# Patient Record
Sex: Male | Born: 1955 | Race: White | Hispanic: No | Marital: Married | Smoking: Never smoker
Health system: Southern US, Community
[De-identification: ages and names within clinical notes are randomized; demographics above are authoritative.]

## PROBLEM LIST (undated history)

## (undated) DIAGNOSIS — I639 Cerebral infarction, unspecified: Secondary | ICD-10-CM

## (undated) DIAGNOSIS — I10 Essential (primary) hypertension: Secondary | ICD-10-CM

## (undated) HISTORY — PX: OTHER SURGICAL HISTORY: SHX169

---

## 2013-07-22 ENCOUNTER — Inpatient Hospital Stay: Payer: Self-pay | Admitting: Internal Medicine

## 2013-07-22 LAB — CBC
HCT: 45.6 % (ref 40.0–52.0)
HGB: 15.2 g/dL (ref 13.0–18.0)
MCH: 30.4 pg (ref 26.0–34.0)
MCHC: 33.4 g/dL (ref 32.0–36.0)
MCV: 91 fL (ref 80–100)
Platelet: 280 10*3/uL (ref 150–440)
RBC: 5.01 10*6/uL (ref 4.40–5.90)
RDW: 13.3 % (ref 11.5–14.5)
WBC: 9.5 10*3/uL (ref 3.8–10.6)

## 2013-07-22 LAB — BASIC METABOLIC PANEL
Anion Gap: 5 — ABNORMAL LOW (ref 7–16)
BUN: 14 mg/dL (ref 7–18)
CHLORIDE: 104 mmol/L (ref 98–107)
CO2: 27 mmol/L (ref 21–32)
CREATININE: 1.09 mg/dL (ref 0.60–1.30)
Calcium, Total: 9.1 mg/dL (ref 8.5–10.1)
EGFR (African American): >60
GLUCOSE: 115 mg/dL — AB (ref 65–99)
Osmolality: 273 (ref 275–301)
Potassium: 3.6 mmol/L (ref 3.5–5.1)
Sodium: 136 mmol/L (ref 136–145)

## 2013-07-22 LAB — TROPONIN I
Troponin-I: <0.02 ng/mL
Troponin-I: <0.02 ng/mL

## 2013-07-22 LAB — CK TOTAL AND CKMB (NOT AT ARMC)
CK, Total: 208 U/L
CK-MB: 1.2 ng/mL (ref 0.5–3.6)

## 2013-07-23 DIAGNOSIS — I517 Cardiomegaly: Secondary | ICD-10-CM

## 2013-07-23 LAB — BASIC METABOLIC PANEL
ANION GAP: 9 (ref 7–16)
BUN: 12 mg/dL (ref 7–18)
Calcium, Total: 9.2 mg/dL (ref 8.5–10.1)
Chloride: 103 mmol/L (ref 98–107)
Co2: 24 mmol/L (ref 21–32)
Creatinine: 1.03 mg/dL (ref 0.60–1.30)
EGFR (African American): >60
Glucose: 162 mg/dL — ABNORMAL HIGH (ref 65–99)
OSMOLALITY: 275 (ref 275–301)
Potassium: 3.9 mmol/L (ref 3.5–5.1)
SODIUM: 136 mmol/L (ref 136–145)

## 2013-07-23 LAB — TROPONIN I
Troponin-I: <0.02 ng/mL
Troponin-I: <0.02 ng/mL

## 2013-07-23 LAB — LIPID PANEL
CHOLESTEROL: 240 mg/dL — AB (ref 0–200)
HDL Cholesterol: 29 mg/dL — ABNORMAL LOW (ref 40–60)
LDL CHOLESTEROL, CALC: 162 mg/dL — AB (ref 0–100)
TRIGLYCERIDES: 245 mg/dL — AB (ref 0–200)
VLDL CHOLESTEROL, CALC: 49 mg/dL — AB (ref 5–40)

## 2013-07-23 LAB — CK TOTAL AND CKMB (NOT AT ARMC)
CK, Total: 270 U/L
CK, Total: 246 U/L
CK-MB: 1.2 ng/mL (ref 0.5–3.6)
CK-MB: 1.5 ng/mL (ref 0.5–3.6)

## 2013-07-23 LAB — MAGNESIUM: Magnesium: 2.1 mg/dL

## 2013-07-23 LAB — HEMOGLOBIN A1C: Hemoglobin A1C: 6.4 % — ABNORMAL HIGH (ref 4.2–6.3)

## 2013-07-23 LAB — TSH: THYROID STIMULATING HORM: 0.739 u[IU]/mL

## 2013-09-05 DIAGNOSIS — E782 Mixed hyperlipidemia: Secondary | ICD-10-CM | POA: Insufficient documentation

## 2013-11-22 DIAGNOSIS — I69359 Hemiplegia and hemiparesis following cerebral infarction affecting unspecified side: Secondary | ICD-10-CM | POA: Insufficient documentation

## 2013-11-22 DIAGNOSIS — I69351 Hemiplegia and hemiparesis following cerebral infarction affecting right dominant side: Secondary | ICD-10-CM | POA: Insufficient documentation

## 2014-03-05 DIAGNOSIS — R001 Bradycardia, unspecified: Secondary | ICD-10-CM | POA: Insufficient documentation

## 2014-03-06 DIAGNOSIS — I6529 Occlusion and stenosis of unspecified carotid artery: Secondary | ICD-10-CM | POA: Insufficient documentation

## 2014-07-24 DIAGNOSIS — E1122 Type 2 diabetes mellitus with diabetic chronic kidney disease: Secondary | ICD-10-CM | POA: Insufficient documentation

## 2014-07-24 DIAGNOSIS — R739 Hyperglycemia, unspecified: Secondary | ICD-10-CM | POA: Insufficient documentation

## 2014-07-24 DIAGNOSIS — N182 Chronic kidney disease, stage 2 (mild): Secondary | ICD-10-CM

## 2014-09-21 NOTE — Consult Note (Signed)
Brief Consult Note: Diagnosis: Left hemishperic CVA, question of small ulcer left carotid.   Recommend further assessment or treatment.   Comments: Patient was taking a bath and unavailable at the time I was able to see him.  He has minimal carotid plaque bilaterally, less than 20% diameter reduction by CT which is insignificant and the question of a small (1 mm) ulcer is insignificant if it does exist.  He has never been on antiplatelet therapy and even in the circumstances of a clinically significant ulceration he would need to fail dual antiplatelet therapy before surgery would be considered.  I would not recommend angiography at this time nor any surgical intervention.  I strongly agree with Dr Manuella Ghazi he should be placed on [facility] ASA 81 mg and a statin.  Work up for possible occult afib should be completed as hsi carotid disease is relatively speaking minor and unlikely to be the source.  Long term better control of his BP will be essential.  Electronic Signatures: Hortencia Pilar (MD)  (Signed 24-Feb-15 18:01)  Authored: Brief Consult Note   Last Updated: 24-Feb-15 18:01 by Hortencia Pilar (MD)

## 2014-09-21 NOTE — H&P (Signed)
PATIENT NAME:  [name], [name] MR#:  [id number] DATE OF BIRTH:  [birth year]  DATE OF ADMISSION:  07/22/2013  PRIMARY CARE PHYSICIAN: None.   REFERRING PHYSICIAN: Dr. Reita Cliche.   CHIEF COMPLAINT: Slurred speech.   HISTORY OF PRESENT ILLNESS: [name] is a 59 year old male with history of obesity. Has not followed with any physician for many years. Comes to the Emergency Department with complaints of slurred speech. Started this morning around 10:30 while in church. This was also associated with some dizziness. The patient denied having any weakness in any part of the body. Concerning this, came to the Emergency Department. Workup in the Emergency Department: The patient was found to have blood pressure of 230/120. The patient received multiple high doses of labetalol without much improvement. CT head without contrast did not show any acute stroke. There were old lacunar infarcts in the pons and right cerebellar peduncle, as well as anterior aspect of the thalamus. The patient currently started on nicardipine drip. The rest of all of the labs are completely within normal limits. The patient denies having any chest pain, palpitations.   PAST MEDICAL HISTORY: None.   PAST SURGICAL HISTORY: None.   ALLERGIES: No known drug allergies.   HOME MEDICATIONS: None.   SOCIAL HISTORY: No history of smoking, drinking alcohol or using illicit drugs. Married, lives with his wife. Retired.   FAMILY HISTORY: Both parents had hypertension.   REVIEW OF SYSTEMS:  CONSTITUTIONAL: Denies any generalized weakness.  EYES: No change in vision.  ENT: No change in hearing. No sore throat.  RESPIRATORY: No cough, shortness of breath.  CARDIOVASCULAR: No chest pain, palpitations.  GASTROINTESTINAL: No nausea, vomiting, abdominal pain.  GENITOURINARY: No dysuria or hematuria.  ENDOCRINE: No polyuria or polydipsia.  SKIN: No rash or lesions.  HEMATOLOGIC: No easy bruising or bleeding.  NEUROLOGIC: Had slurred speech. No  weakness or numbness.  PSYCHIATRIC: No depression.   PHYSICAL EXAMINATION:  GENERAL: This is a well-built, well-nourished, obese male lying down in the bed, not in distress.  VITAL SIGNS: Temperature , pulse 79, blood pressure 210/110, respiratory rate of 16, oxygen saturation 96% on 2 liters of oxygen.  HEENT: Head normocephalic, atraumatic. There is no scleral icterus. Conjunctivae normal. Pupils equal and react to light. Extraocular movements are intact. Mucous membranes moist. No pharyngeal erythema.  NECK: Supple. No lymphadenopathy. No JVD. No carotid bruit. No thyromegaly.  CHEST: Has no focal tenderness.  LUNGS: Bilaterally clear to auscultation.  HEART: S1, S2 regular. No murmurs are heard. No pedal edema. Pulses 2+.  ABDOMEN: Obese. Bowel sounds present. Soft, nontender and nondistended. Could not appreciate any hepatosplenomegaly.  NEUROLOGIC: The patient is alert, oriented to place, person and time. Cranial nerves II through XII intact. Motor 5/5 in upper and lower extremities.   LABORATORY DATA: CBC and CMP are completely within normal limits. CT head without contrast: Old lacunar infarcts as mentioned above.   Troponin less than 0.02.   EKG, 12-lead: Normal sinus rhythm with no ST-T wave abnormalities. Sinus tachycardia.   ASSESSMENT AND PLAN: [name] is a 59 year old male, comes to the Emergency Department with accelerated hypertension with neurological symptoms.  1. Hypertension, malignant: Admit the patient to the intensive care unit. Continue with the nicardipine drip. Consider controlling the blood pressure over 2 to 3 weeks' time. Will keep the blood pressure around 170 to 180. Also concerned about the patient having obstructive sleep apnea, which might be contributing to the hypertension.  2. Obesity: Counseled with the patient regarding diet  and exercise.   3. Keep the patient on deep vein thrombosis prophylaxis with Lovenox.   TIME SPENT: 50 minutes.     ____________________________ Monica Becton, MD pv:gb D: 07/22/2013 21:29:24 ET T: 07/22/2013 23:17:47 ET JOB#: [id number]  cc: Monica Becton, MD, <Dictator> Monica Becton MD ELECTRONICALLY SIGNED 08/05/2013 22:34

## 2014-09-21 NOTE — Consult Note (Signed)
Primary Care Physician:  Monica Becton : [facility], [address] [place], [place] [address], [place] [phone]  Reason for Consult: Admit Date: 23-Jul-2013  Chief Complaint: rigth face weakness   History of Present Illness: History of Present Illness:   [name] is a 59 year old male with history of obesity. Has not followed with any physician for many years. Comes to the Emergency Department with complaints of slurred speech. Started on 07/22/13 morning around 10:30 while in church, pain less. This was also associated with some dizziness. No recent head trauma, fever, similar recent episode etc. Concerning this, came to the Emergency Department. Workup in the Emergency Department: The patient was found to have blood pressure of 230/120. The patient received multiple high doses of labetalol without much improvement. CT head without contrast did not show any acute stroke. There were old lacunar infarcts in the pons and right cerebellar peduncle, as well as anterior aspect of the thalamus. The patient currently started on nicardipine drip. The rest of all of the labs are completely within normal limits. The patient denies having any chest pain, palpitations.  was not on any antiplatelets. admission found to have left carotid possible ulcer on [place], now after CTA - very tiny possible ulcer on left carotid.  MEDICAL HISTORY: None.  (but likely had undocumented HTN - ECHO showed moderate LVH) SURGICAL HISTORY: None.  No known drug allergies.  MEDICATIONS: None.  HISTORY: No history of smoking, drinking alcohol or using illicit drugs. Married, lives with his wife. Retired.  HISTORY: Both parents had hypertension.   ROS:  General denies complaints   HEENT no complaints   Lungs no complaints   Cardiac no complaints   GI no complaints   GU no complaints   Musculoskeletal no complaints   Extremities no complaints   Skin no complaints   Neuro slurred speech, facial  droop, right hand weakness   Endocrine no complaints   Psych no complaints   Past Medical/Surgical Hx:  old Lacunar infarcts: per H/P  Obesity:   [facility] Current Meds:  Acetaminophen * tablet, ( Tylenol (325 mg) tablet)  650 mg Oral q4h PRN for pain or temp. greater than 100.4  - Indication: Pain/Fever  amLODIPine tablet, ( Norvasc)  10 mg Oral daily  - Indication: Hypertension/ Angina  Enoxaparin injection, ( Lovenox injection )  40 mg, Subcutaneous, <User Schedule> ( every 1 day: 21:00 )  Indication: Prophylaxis or treatment of thromboembolic disorders, Monitor Anticoags per hospital protocol  Lisinopril tablet, ( Zestril)  20 mg Oral daily  - Indication: Hypertension/ CHF  Ondansetron injection, ( Zofran injection )  4 mg, IV push, q4h PRN for Nausea/Vomiting  Indication: Nausea/ Vomiting  Nursing Saline Flush, 3 to 6 ml, IV push, Q1M PRN for IV Maintenance  atorvaSTATin tablet, 20 mg Oral daily  - Indication: Hypercholesterolemia  hydrALAZINE HCl injection, ( Apresoline injection )  10 mg, IV push, q6h PRN for hypertension  Indication: Hypertension/ CHF, Give when systolic>180  Hydrochlorothiazide tablet,  ( HCTZ)  25 mg Oral daily  - Indication: Edema/ Hypertension/ Diuresis/ CHF  Aspirin Chewable, 81 mg Oral daily  - Indication: Pain/Fever/Thromboembolic Disorders/Post MI/Prophylaxis MI  Allergies:  No Known Allergies:   Vital Signs: **Vital Signs.:   24-Feb-15 13:29  Vital Signs Type Routine  Temperature Temperature (F) 97.9  Celsius 36.6  Temperature Source oral  Pulse Pulse 93  Respirations Respirations 20  Systolic BP Systolic BP 342  Diastolic BP (mmHg) Diastolic BP (mmHg) 91  Mean BP 115  Pulse Ox % Pulse Ox % 96  Pulse Ox Activity Level  At rest  Oxygen Delivery Room Air/ 21 %   EXAM: General Exam Patient looks appropriate of age, well built, nourished and appropriately groomed.   Cardiovascular Exam: S1, S2 heart sounds present Carotid exam  revealed no bruit Lung exam was clear to auscultation belly soft but protruberant  Neurological Exam      Mental Status:      Alert,     Oriented to time, place, person and situation     Attention span and concentration seemed appropriate     Memory seemed OK.     Intact naming, repetition, comprehension.  (mild dysarthria)     Followed 2 step commands - no dysarthria     Fund of knowledge seemed appropriate for age and health status.       Cranial Nerves:      Olfactory and vagus nerves not are examined      Visual fields were full      Pupils were equal, round and reactive to light and accommodation      Extra-ocular movements are normal      Facial sensations are normal       Face is asymmetric (right facial weakness)       Finger rub was heard symmetric in both ears      Palate and uvular movements are normal and oral sensations are OK      Neck muscle strength and shoulder shrug is normal      Tongue protrusion and uvular elevation are normal       Motor Exam:      Tone is normal in all extremities       Muscle strength in all extremities is 5/5 except right pronator drift       No abnormal movements, fasciculations or atrophy seen       Deep Tendon Reflexes:      symmetric 2 +      Right Toes are down going,  Left Toes are down going            Sensory Exam:      Sensations were intact to light touch in all extremities      Vibration and proprioception are also intact            Co-ordination:      Finger to nose is normal            Gait:      Gait and station seemed OK.  Lab Results: Thyroid:  23-Feb-15 02:43   Thyroid Stimulating Hormone 0.739 (0.45-4.50 (International Unit)  ----------------------- Pregnant patients have  different reference  ranges for TSH:  - - - - - - - - - -  Pregnant, first trimetser:  0.36 - 2.50 uIU/mL)  LabObservation:  23-Feb-15 16:29   OBSERVATION Reason for Test  Routine Chem:  23-Feb-15 02:43   Hemoglobin A1c  ([facility])  6.4 (The American Diabetes Association recommends that a primary goal of therapy should be <7% and that physicians should reevaluate the treatment regimen in patients with HbA1c values consistently >8%.)  Glucose, Serum  162  BUN 12  Creatinine (comp) 1.03  Sodium, Serum 136  Potassium, Serum 3.9  Chloride, Serum 103  CO2, Serum 24  Calcium (Total), Serum 9.2  Anion Gap 9  Osmolality (calc) 275  eGFR (African American) >60  eGFR (Non-African American) >60 (eGFR values <24m/min/1.73 m2 may be an  indication of chronic kidney disease (CKD). Calculated eGFR is useful in patients with stable renal function. The eGFR calculation will not be reliable in acutely ill patients when serum creatinine is changing rapidly. It is not useful in  patients on dialysis. The eGFR calculation may not be applicable to patients at the low and high extremes of body sizes, pregnant women, and vegetarians.)  Magnesium, Serum 2.1 (1.8-2.4 THERAPEUTIC RANGE: 4-7 mg/dL TOXIC: > 10 mg/dL  -----------------------)  Cholesterol, Serum  240  Triglycerides, Serum  245  HDL (INHOUSE)  29  VLDL Cholesterol Calculated  49  LDL Cholesterol Calculated  162 (Result(s) reported on 23 Jul 2013 at 03:19AM.)  Cardiac:  23-Feb-15 06:34   Troponin I < 0.02 (0.00-0.05 0.05 ng/mL or less: NEGATIVE  Repeat testing in 3-6 hrs  if clinically indicated. >0.05 ng/mL: POTENTIAL  MYOCARDIAL INJURY. Repeat  testing in 3-6 hrs if  clinically indicated. NOTE: An increase or decrease  of 30% or more on serial  testing suggests a  clinically important change)  CK, Total 246 (39-308 NOTE: NEW REFERENCE RANGE  07/02/2013)  CPK-MB, Serum 1.5 (Result(s) reported on 23 Jul 2013 at 07:11AM.)  Routine Hem:  22-Feb-15 18:27   WBC (CBC) 9.5  RBC (CBC) 5.01  Hemoglobin (CBC) 15.2  Hematocrit (CBC) 45.6  Platelet Count (CBC) 280 (Result(s) reported on 22 Jul 2013 at 06:39PM.)  MCV 91  MCH 30.4  MCHC 33.4  RDW 13.3    Radiology Impression: Radiology Impression: MRI brain - left MCA territory - lentriculostriate branch ischemic infract - small to medium vessel disease. Old pontine and right middle cerebellar peduncle. Other WM microvascular ischemic changes.    CTA - very small ulcer on left ICA origin   Impression/Recommendations: Recommendations:   Stroke: left MCA territory - lentriculostriate branch ischemic infract in left putamenand symptoms: right lower facial weakness, dysarthria, right UE pronator drift and possible right face and arm paresthesia, weakness appropriate ataxia of right UE.of onset: 07/22/13 10:30 am, was not candidate for tPA due to low NIHSS (total 4, right face 2, dysarthria 1, right UE 1)mechanism of stroke - small vessel disease - can't rule out the role of embolization from very small ulcer from left ICA origin (but it is very small around 1 mm).agree with vascular consult - they may consider catheter angiography (pt don't believe in medicines and may not want to pursue anything) antiplatelet agent - none, up: if not already ordered, please consider moderate LVH rest OK,vessel ultrasound (carotid and vertebrals): as abovemonitoring: to look for arrythmia as potential cause of infractlipid profile: elevated LDL, Triglyceride and low HDL, avoid extreme  hypo/hyper tension and gentle BP reducation in peristroke period to maintain perfusion in ischemic penumbra. (160/90 with MAP 110. Consider using short acting meds with short half life, avoid NTG and nitropruside due to chance of increasing intracranial pressure)treat fever early (hyperthermia impairs stroke recovery), aggressive PT/OT/ST, early bedside swallow evaluation. Antiplatelet therapy with ASA 81 mg /day (pt not interested in taking any meds in general)Agree with atorvastatin (Lipid lowering and non lipid lowering effects of statin are useful in secondary stroke prevention.pt should be considered for evaluation of OSA which is a  treatable risk factor (obese man, high mallampati score, some excessive daytime sleepiness).Post discharge long term cardiac rhythm monitoring has shown much higher incidence of paroxysmal A.fib in patient's with cryptogenic ischemic infract.DVT prophylaxis Consider flu vaccine and multivitamines.  will follow.  Electronic Signatures: Ray Church (MD)  (Signed [phone] 16:04)  Authored:  Primary Care Physician, Consult, History of Present Illness, Review of Systems, PAST MEDICAL/SURGICAL HISTORY, Current Medications, ALLERGIES, NURSING VITAL SIGNS, Physical Exam-, LAB RESULTS, RADIOLOGY RESULTS, Recommendations   Last Updated: 24-Feb-15 16:04 by Ray Church (MD)

## 2014-09-21 NOTE — Discharge Summary (Signed)
PATIENT NAME:  NIKALAS, BRAMEL MR#:  [id number] DATE OF BIRTH:  19-Aug-1955  DATE OF ADMISSION:  07/22/2013  DATE OF DISCHARGE:  07/25/2013   DISCHARGE DIAGNOSES:  1.  Acute stroke, residual right-sided weakness, and need for physical therapy.  2.  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 Vascular surgery called and they suggested antiplatelet with aspirin.  3.  Hypertension, uncontrolled. Started on medication.  4.  Hyperlipidemia.  5.  HbA1c 6.4. Advised to have low-sugar diet.   CODE STATUS: FULL CODE.   MEDICATIONS ON DISCHARGE:  1.  Lisinopril 20 mg once a day.  2.  Atorvastatin 20 mg once a day.  3.  Aspirin 81 mg once a day. 4.  Metoprolol 25 mg two times a day.  5.  Amlodipine 10 mg once a day.  6.  Hydrochlorothiazide 25 mg oral tablet once a day.   DIET ON DISCHARGE: Low-sodium, low-fat, low-cholesterol, carbohydrate-controlled ADA diet. Diet consistency: Regular.   ACTIVITY: As tolerated.   TIMEFRAME TO FOLLOW-UP: Within 1 to 2 weeks with primary care physician and 2 to 4 weeks with Dr. Manuella Ghazi in neurology clinic.   HISTORY OF PRESENTING ILLNESS: A 59 year old with a past medical history of obesity who came to the Emergency Department with complaint of slurred speech. It started in the morning around 10:30 when he was in church with some dizziness also. He denied any weakness in any part of the body. Concerning this, came to the Emergency Department.   In the ER, his blood pressure was found to be 230/120. Received multiple doses of labetalol without much improvement. CT scan of the head did not show any stroke but there was some old lacunar infarct   HOSPITAL COURSE AND STAY:  1.  He was started on nicardipine drip and admitted to critical care unit overnight. His blood pressure came fairly under control to systolic 157W and 620B, but he still had some persistent weakness on the right side of his body and some facial weakness, so we did MRI of the brain  which  was positive for the new stroke. Full stroke work-up was done including carotid Doppler and echocardiogram. On the carotid Doppler study, there was some questionable observation on the left side of internal carotid artery, so CT angiogram of the carotid artery was done as per recommendation by vascular surgeon, and it was 1 mm ulceration. As per vascular, that was too small to have any effect like stroke, and advised just to continue on aspirin and antiplatelet agent. Neurologic consult was also called in and they agreed and advised to follow up in cardiology clinic for further long-term monitoring of arrhythmia.  2.  Malignant hypertension on presentation. Initially was on a Cardizem drip but later on oral medication started and it was fairly under control.  3.  Hyperlipidemia. Started on statin and remained under control.  4.  Hyperglycemia, HbA1c was 6.4. Advised to have proper diet control and follow with primary care physician.   CONSULTATIONS IN THE HOSPITAL: Neurology consult with Dr. Jennings Books, vascular consult with Dr. Delana Meyer.  IMPORTANT LABORATORY RESULTS: Creatinine was 1.09, sodium was 136, potassium was 316. WBC was 9.5, hemoglobin 15.2, platelet count 280. Troponin less than 0.02.   CT scan of the head was negative.   Echocardiogram:  Left ventricular ejection fraction 55% to 60%, normal global left ventricular systolic function, moderate concentric left ventricular hypertrophy, mild aortic valve sclerosis without stenosis.   MRA of the brain without contrast:  Acute lenticulostriate territory infarct affecting the left basal ganglia and posterior limb of internal capsule. No hemorrhage.   CT angiography of the neck for carotid: One mm ulceration left internal carotid artery associated with calcified and noncalcified plaque.  Correlate clinically for acute left peri-infarct.   TOTAL TIME SPENT ON THIS DISCHARGE: 40 minutes.   ____________________________ Ceasar Lund Anselm Jungling,  MD vgv:np D: 07/29/2013 00:04:00 ET T: 07/29/2013 16:39:30 ET JOB#: [id number]  cc: Ceasar Lund. Anselm Jungling, MD, <Dictator> Vaughan Basta MD ELECTRONICALLY SIGNED 08/06/2013 7:52

## 2015-01-24 DIAGNOSIS — Z0000 Encounter for general adult medical examination without abnormal findings: Secondary | ICD-10-CM | POA: Insufficient documentation

## 2015-05-25 ENCOUNTER — Emergency Department
Admission: EM | Admit: 2015-05-25 | Discharge: 2015-05-26 | Disposition: A | Discharge status: Home / Self Care | Payer: BC Managed Care – PPO | Attending: Emergency Medicine | Admitting: Emergency Medicine

## 2015-05-25 ENCOUNTER — Emergency Department: Payer: BC Managed Care – PPO

## 2015-05-25 ENCOUNTER — Encounter: Payer: Self-pay | Admitting: *Deleted

## 2015-05-25 DIAGNOSIS — R200 Anesthesia of skin: Secondary | ICD-10-CM | POA: Diagnosis present

## 2015-05-25 DIAGNOSIS — R519 Headache, unspecified: Secondary | ICD-10-CM

## 2015-05-25 DIAGNOSIS — F419 Anxiety disorder, unspecified: Secondary | ICD-10-CM | POA: Insufficient documentation

## 2015-05-25 DIAGNOSIS — R51 Headache: Secondary | ICD-10-CM | POA: Insufficient documentation

## 2015-05-25 DIAGNOSIS — I10 Essential (primary) hypertension: Secondary | ICD-10-CM | POA: Insufficient documentation

## 2015-05-25 DIAGNOSIS — R202 Paresthesia of skin: Secondary | ICD-10-CM | POA: Diagnosis not present

## 2015-05-25 DIAGNOSIS — R4781 Slurred speech: Secondary | ICD-10-CM | POA: Insufficient documentation

## 2015-05-25 HISTORY — DX: Essential (primary) hypertension: I10

## 2015-05-25 HISTORY — DX: Cerebral infarction, unspecified: I63.9

## 2015-05-25 LAB — CBC
HEMATOCRIT: 40 % (ref 40.0–52.0)
HEMOGLOBIN: 13.9 g/dL (ref 13.0–18.0)
MCH: 31.2 pg (ref 26.0–34.0)
MCHC: 34.8 g/dL (ref 32.0–36.0)
MCV: 89.8 fL (ref 80.0–100.0)
Platelets: 291 10*3/uL (ref 150–440)
RBC: 4.45 MIL/uL (ref 4.40–5.90)
RDW: 12.8 % (ref 11.5–14.5)
WBC: 9.4 10*3/uL (ref 3.8–10.6)

## 2015-05-25 LAB — PROTIME-INR
INR: 1.03
Prothrombin Time: 13.7 seconds (ref 11.4–15.0)

## 2015-05-25 LAB — COMPREHENSIVE METABOLIC PANEL
ALBUMIN: 4.3 g/dL (ref 3.5–5.0)
ALK PHOS: 96 U/L (ref 38–126)
ALT: 37 U/L (ref 17–63)
ANION GAP: 10 (ref 5–15)
AST: 28 U/L (ref 15–41)
BILIRUBIN TOTAL: 0.5 mg/dL (ref 0.3–1.2)
BUN: 21 mg/dL — AB (ref 6–20)
CALCIUM: 9.3 mg/dL (ref 8.9–10.3)
CO2: 25 mmol/L (ref 22–32)
Chloride: 102 mmol/L (ref 101–111)
Creatinine, Ser: 1.21 mg/dL (ref 0.61–1.24)
GFR calc Af Amer: >60 mL/min (ref >60)
GLUCOSE: 157 mg/dL — AB (ref 65–99)
Potassium: 3.2 mmol/L — ABNORMAL LOW (ref 3.5–5.1)
Sodium: 137 mmol/L (ref 135–145)
TOTAL PROTEIN: 8.5 g/dL — AB (ref 6.5–8.1)

## 2015-05-25 LAB — DIFFERENTIAL
Basophils Absolute: 0 10*3/uL (ref 0–0.1)
Basophils Relative: 0 %
EOS PCT: 1 %
Eosinophils Absolute: 0.1 10*3/uL (ref 0–0.7)
LYMPHS ABS: 2 10*3/uL (ref 1.0–3.6)
LYMPHS PCT: 21 %
MONOS PCT: 11 %
Monocytes Absolute: 1 10*3/uL (ref 0.2–1.0)
NEUTROS PCT: 67 %
Neutro Abs: 6.3 10*3/uL (ref 1.4–6.5)

## 2015-05-25 LAB — APTT: aPTT: 32 seconds (ref 24–36)

## 2015-05-25 NOTE — ED Notes (Addendum)
Pt arrived to ED reporting right sided numbness \\\[id number]\nd heaviness beginning yesterday at 1000am. Pt has a hx of stroke 2 years ago that effected right side but pt and pts wife agree right sided facial droop is new. \\\[id number]\[id number]\Pt reports having slurred speech on and off since last stroke but PCP is aware. Pt reports numbness has decreased since yesterday. Pt reports head feels "cloudy and full of pressure"

## 2015-05-25 NOTE — Discharge Instructions (Signed)
You examine evaluation are reassuring emergent chart. Increase your aspirin from 81 mg to 325 mg daily. Follow-up with primary care physician X2 to 3 days. Also recommended follow-up with neurologist at next available.  Return to the emergency department for any worsening condition or any new weakness, numbness, tingling, slurred speech, confusion or altered mental status.  Transient Ischemic Attack A transient ischemic attack (TIA) is a "warning stroke" that causes stroke-like symptoms. Unlike a stroke, a TIA does not cause permanent damage to the brain. The symptoms of a TIA can happen very fast and do not last long. It is important to know the symptoms of a TIA and what to do. This can help prevent a major stroke or death. CAUSES  A TIA is caused by a temporary blockage in an artery in the brain or neck (carotid artery). The blockage does not allow the brain to get the blood supply it needs and can cause different symptoms. The blockage can be caused by either:  A blood clot.  Fatty buildup (plaque) in a neck or brain artery. RISK FACTORS  High blood pressure (hypertension).  High cholesterol.  Diabetes mellitus.  Heart disease.  The buildup of plaque in the blood vessels (peripheral artery disease or atherosclerosis).  The buildup of plaque in the blood vessels that provide blood and oxygen to the brain (carotid artery stenosis).  An abnormal heart rhythm (atrial fibrillation).  Obesity.  Using any tobacco products, including cigarettes, chewing tobacco, or electronic cigarettes.  Taking oral contraceptives, especially in combination with using tobacco.  Physical inactivity.  A diet high in fats, salt (sodium), and calories.  Excessive alcohol use.  Use of illegal drugs (especially cocaine and methamphetamine).  Being male.  Being African American.  Being over the age of 72 years.  Family history of stroke.  Previous history of blood clots, stroke, TIA, or heart  attack.  Sickle cell disease. SIGNS AND SYMPTOMS  TIA symptoms are the same as a stroke but are temporary. These symptoms usually develop suddenly, or may be newly present upon waking from sleep:  Sudden weakness or numbness of the face, arm, or leg, especially on one side of the body.  Sudden trouble walking or difficulty moving arms or legs.  Sudden confusion.  Sudden personality changes.  Trouble speaking (aphasia) or understanding.  Difficulty swallowing.  Sudden trouble seeing in one or both eyes.  Double vision.  Dizziness.  Loss of balance or coordination.  Sudden severe headache with no known cause.  Trouble reading or writing.  Loss of bowel or bladder control.  Loss of consciousness. DIAGNOSIS  Your health care provider may be able to determine the presence or absence of a TIA based on your symptoms, history, and physical exam. CT scan of the brain is usually performed to help identify a TIA. Other tests may include:  Electrocardiography (ECG).  Continuous heart monitoring.  Echocardiography.  Carotid ultrasonography.  MRI.  A scan of the brain circulation.  Blood tests. TREATMENT  Since the symptoms of TIA are the same as a stroke, it is important to seek treatment as soon as possible. You may need a medicine to dissolve a blood clot (thrombolytic) if that is the cause of the TIA. This medicine cannot be given if too much time has passed. Treatment may also include:   Rest, oxygen, fluids through an IV tube, and medicines to thin the blood (anticoagulants).  Measures will be taken to prevent short-term and long-term complications, including infection from breathing foreign  material into the lungs (aspiration pneumonia), blood clots in the legs, and falls.  Procedures to either remove plaque in the carotid arteries or dilate carotid arteries that have narrowed due to plaque. Those procedures are:  Carotid endarterectomy.  Carotid angioplasty and  stenting.  Medicines and diet may be used to address diabetes, high blood pressure, and other underlying risk factors. HOME CARE INSTRUCTIONS   Take medicines only as directed by your health care provider. Follow the directions carefully. Medicines may be used to control risk factors for a stroke. Be sure you understand all your medicine instructions.  You may be told to take aspirin or the anticoagulant warfarin. Warfarin needs to be taken exactly as instructed.  Taking too much or too little warfarin is dangerous. Too much warfarin increases the risk of bleeding. Too little warfarin continues to allow the risk for blood clots. While taking warfarin, you will need to have regular blood tests to measure your blood clotting time. A PT blood test measures how long it takes for blood to clot. Your PT is used to calculate another value called an INR. Your PT and INR help your health care provider to adjust your dose of warfarin. The dose can change for many reasons. It is critically important that you take warfarin exactly as prescribed.  Many foods, especially foods high in vitamin K can interfere with warfarin and affect the PT and INR. Foods high in vitamin K include spinach, kale, broccoli, cabbage, collard and turnip greens, Brussels sprouts, peas, cauliflower, seaweed, and parsley, as well as beef and pork liver, green tea, and soybean oil. You should eat a consistent amount of foods high in vitamin K. Avoid major changes in your diet, or notify your health care provider before changing your diet. Arrange a visit with a dietitian to answer your questions.  Many medicines can interfere with warfarin and affect the PT and INR. You must tell your health care provider about any and all medicines you take; this includes all vitamins and supplements. Be especially cautious with aspirin and anti-inflammatory medicines. Do not take or discontinue any prescribed or over-the-counter medicine except on the  advice of your health care provider or pharmacist.  Warfarin can have side effects, such as excessive bruising or bleeding. You will need to hold pressure over cuts for longer than usual. Your health care provider or pharmacist will discuss other potential side effects.  Avoid sports or activities that may cause injury or bleeding.  Be careful when shaving, flossing your teeth, or handling sharp objects.  Alcohol can change the body's ability to handle warfarin. It is best to avoid alcoholic drinks or consume only very small amounts while taking warfarin. Notify your health care provider if you change your alcohol intake.  Notify your dentist or other health care providers before procedures.  Eat a diet that includes 5 or more servings of fruits and vegetables each day. This may reduce the risk of stroke. Certain diets may be prescribed to address high blood pressure, high cholesterol, diabetes, or obesity.  A diet low in sodium, saturated fat, trans fat, and cholesterol is recommended to manage high blood pressure.  A diet low in saturated fat, trans fat, and cholesterol, and high in fiber may control cholesterol levels.  A controlled-carbohydrate, controlled-sugar diet is recommended to manage diabetes.  A reduced-calorie diet that is low in sodium, saturated fat, trans fat, and cholesterol is recommended to manage obesity.  Maintain a healthy weight.  Stay physically active. It  is recommended that you get at least 30 minutes of activity on most or all days.  Do not use any tobacco products, including cigarettes, chewing tobacco, or electronic cigarettes. If you need help quitting, ask your health care provider.  Limit alcohol intake to no more than 1 drink per day for nonpregnant women and 2 drinks per day for men. One drink equals 12 ounces of beer, 5 ounces of wine, or 1 ounces of hard liquor.  Do not abuse drugs.  A safe home environment is important to reduce the risk of  falls. Your health care provider may arrange for specialists to evaluate your home. Having grab bars in the bedroom and bathroom is often important. Your health care provider may arrange for equipment to be used at home, such as raised toilets and a seat for the shower.  Follow all instructions for follow-up with your health care provider. This is very important. This includes any referrals and lab tests. Proper follow-up can prevent a stroke or another TIA from occurring. PREVENTION  The risk of a TIA can be decreased by appropriately treating high blood pressure, high cholesterol, diabetes, heart disease, and obesity, and by quitting smoking, limiting alcohol, and staying physically active. SEEK MEDICAL CARE IF:  You have personality changes.  You have difficulty swallowing.  You are seeing double.  You have dizziness.  You have a fever. SEEK IMMEDIATE MEDICAL CARE IF:  Any of the following symptoms may represent a serious problem that is an emergency. Do not wait to see if the symptoms will go away. Get medical help right away. Call your local emergency services (911 in [place] Do not drive yourself to the hospital.  You have sudden weakness or numbness of the face, arm, or leg, especially on one side of the body.  You have sudden trouble walking or difficulty moving arms or legs.  You have sudden confusion.  You have trouble speaking (aphasia) or understanding.  You have sudden trouble seeing in one or both eyes.  You have a loss of balance or coordination.  You have a sudden, severe headache with no known cause.  You have new chest pain or an irregular heartbeat.  You have a partial or total loss of consciousness. MAKE SURE YOU:   Understand these instructions.  Will watch your condition.  Will get help right away if you are not doing well or get worse.   This information is not intended to replace advice given to you by your health care provider. Make sure you discuss  any questions you have with your health care provider.   Document Released: 02/24/2005 Document Revised: 06/07/2014 Document Reviewed: 08/22/2013 Elsevier Interactive Patient Education [employer].

## 2015-05-25 NOTE — ED Provider Notes (Signed)
[facility] Emergency Department Provider Note   ____________________________________________  Time seen:  I have reviewed the triage vital signs and the triage nursing note.  HISTORY  Chief Complaint Numbness   Historian Patient and spouse  HPI [name] is a 59 y.o. male with history of prior stroke in 2015 as well as hypertension, takes a baby aspirin daily, who is here for evaluation of an episode of right face tingling yesterday, which is now gone, generalized head "pressure" today which is persistent, and an episode of right arm numbness and tingling and heaviness today.  It is better now. Patient states he does have some problems with aphasia/slurred speech since his prior stroke. His prior stroke symptoms were slurred speech.  In triage, nurse noted possible right facial droop. When I questioned the patient's wife, she states that his face and speech looks normal to her.    Past Medical History  Diagnosis Date  . Stroke ([place])   . Hypertension     There are no active problems to display for this patient.   History reviewed. No pertinent past surgical history.  No current outpatient prescriptions on file.aspirin 81 mg  Allergies Review of patient's allergies indicates not on file.  History reviewed. No pertinent family history.  Social History Social History  Substance Use Topics  . Smoking status: Never Smoker   . Smokeless tobacco: None  . Alcohol Use: No    Review of Systems  Constitutional: Negative for fever. Eyes: Negative for visual changes. ENT: Negative for sore throat. Cardiovascular: Negative for chest pain. Respiratory: Negative for shortness of breath. Gastrointestinal: Negative for abdominal pain, vomiting and diarrhea. Genitourinary: Negative for dysuria. Musculoskeletal: Negative for back pain. Skin: Negative for rash. Neurological: positive for headache/ described as pressure and global. 10 point Review of  Systems otherwise negative ____________________________________________   PHYSICAL EXAM:  VITAL SIGNS: ED Triage Vitals  Enc Vitals Group     BP 05/25/15 1837 172/77 mmHg     Pulse Rate 05/25/15 1837 97     Resp 05/25/15 1837 16     Temp 05/25/15 1837 97.9 F (36.6 C)     Temp Source 05/25/15 1837 Oral     SpO2 05/25/15 1930 93 %     Weight 05/25/15 1837 212 lb (96.163 kg)     Height 05/25/15 1837 5\' 10"  (1.778 m)     Head Cir --      Peak Flow --      Pain Score 05/25/15 2121 0     Pain Loc --      Pain Edu? --      Excl. in [place]? --      Constitutional: Alert and oriented. Well appearing and in no distress. Eyes: Conjunctivae are normal. PERRL. Normal extraocular movements. ENT   Head: Normocephalic and atraumatic.   Nose: No congestion/rhinnorhea.   Mouth/Throat: Mucous membranes are moist.   Neck: No stridor. Cardiovascular/Chest: Normal rate, regular rhythm.  No murmurs, rubs, or gallops. Respiratory: Normal respiratory effort without tachypnea nor retractions. Breath sounds are clear and equal bilaterally. No wheezes/rales/rhonchi. Gastrointestinal: Soft. No distention, no guarding, no rebound. Nontender   Genitourinary/rectal:Deferred Musculoskeletal: Nontender with normal range of motion in all extremities. No joint effusions.  No lower extremity tenderness.  No edema. Neurologic:  Some stuttering of speech, and slight slurred speech. Left lip elevates more than the right side of the mouth with speech, however with smile he has no evidence of right-sided facial droop. Sensation on the  face is normal.  5 out of 5 strength to testing in the upper extremities and lower extremity bilaterally. Patient does have sensation of heaviness in the right upper extremity. No paresthesia to extremities at present Skin:  Skin is warm, dry and intact. No rash noted. Psychiatric: patient somewhat anxious especially about possibly of new stroke.Mood and affect are normal.  Speech and behavior are normal. Patient exhibits appropriate insight and judgment.  ____________________________________________   EKG I, Lisa Roca, MD, the attending physician have personally viewed and interpreted all ECGs.  89 bpm. Normal sinus rhythm. Narrow QRS. Normal axis. Normal ST and T-wave ____________________________________________  LABS (pertinent positives/negatives)  INR 1.03 CBC within normal limits Comprehensive metabolic panel significant for potassium 3.2 otherwise without significant abnormality. Glucose is 157  ____________________________________________  RADIOLOGY All Xrays were viewed by me. Imaging interpreted by Radiologist.  CT head noncontrast:  IMPRESSION: No acute intracranial hemorrhage.  Mild age-related atrophy and chronic microvascular ischemic disease.  If symptoms persist and there are no contraindications, MRI may provide better evaluation if clinically indicated  MRI brain: Pending __________________________________________  PROCEDURES  Procedure(s) performed: None  Critical Care performed: None  ____________________________________________   ED COURSE / ASSESSMENT AND PLAN  CONSULTATIONS: None  Pertinent labs & imaging results that were available during my care of the patient were reviewed by me and considered in my medical decision making (see chart for details).  Patient's symptoms are concerning for a TIA versus possible small stroke. Head CT negative. Patient has a minor and improving symptoms and is therefore not a TPA candidate. NIH stroke scale 1.  However, I am still concerned about possibility of stroke vs TIA in patient with stuttering symptoms.    Will obtain Mri brain to eval for signs of new/acute stroke/TIA.  ----------------------------------------- 10:27 PM on 05/25/2015 -----------------------------------------  I discussed this case with on-call neurologist Dr.Zeylickman that recommended if  persistent symptoms admit to hospital, if symptoms resolved and MRI brain negative for new sign of stroke, increase baby aspirin to 325mg  aspirin and ok for discharge and outpatient follow up.    Patient care transferred to [place] at [place].  MRI brain pending.  If negative anticipate discharge home for outpatient follow up, discharged by my instructions.  If positive for stroke, anticipate admission.    Patient / Family / Caregiver informed of clinical course, medical decision-making process, and agree with plan.    ___________________________________________   FINAL CLINICAL IMPRESSION(S) / ED DIAGNOSES   Final diagnoses:  Pressure in head  Right arm and right face tingling       Lisa Roca, MD 05/30/15 [phone]

## 2015-05-26 NOTE — ED Notes (Signed)
Pt dc home ambulatory denies pain instructed on follow up plan no RX given pt states he will go to his own neurologist not the one listed on DC paperwork PT NAD AT DC

## 2015-05-26 NOTE — ED Provider Notes (Signed)
-----------------------------------------   1:03 AM on 05/26/2015 -----------------------------------------  Patient's MRI shows no acute abnormality. I discussed these results with the patient, we will discharge the patient home with primary care follow-up. Patient is agreeable to plan.  Harvest Dark, MD 05/26/15 [phone]

## 2015-05-27 ENCOUNTER — Emergency Department
Admission: EM | Admit: 2015-05-27 | Discharge: 2015-05-27 | Disposition: A | Discharge status: Home / Self Care | Payer: BC Managed Care – PPO | Attending: Emergency Medicine | Admitting: Emergency Medicine

## 2015-05-27 DIAGNOSIS — Z79899 Other long term (current) drug therapy: Secondary | ICD-10-CM | POA: Diagnosis not present

## 2015-05-27 DIAGNOSIS — R42 Dizziness and giddiness: Secondary | ICD-10-CM | POA: Diagnosis not present

## 2015-05-27 DIAGNOSIS — I10 Essential (primary) hypertension: Secondary | ICD-10-CM | POA: Diagnosis not present

## 2015-05-27 DIAGNOSIS — R200 Anesthesia of skin: Secondary | ICD-10-CM | POA: Diagnosis not present

## 2015-05-27 DIAGNOSIS — R202 Paresthesia of skin: Secondary | ICD-10-CM | POA: Insufficient documentation

## 2015-05-27 DIAGNOSIS — Z7982 Long term (current) use of aspirin: Secondary | ICD-10-CM | POA: Insufficient documentation

## 2015-05-27 MED ORDER — LORAZEPAM 0.5 MG PO TABS: 0.5000 mg | ORAL_TABLET | Freq: Three times a day (TID) | ORAL | Status: AC | PRN

## 2015-05-27 MED ORDER — LORAZEPAM 0.5 MG PO TABS
0.5000 mg | ORAL_TABLET | Freq: Once | ORAL | Status: AC
Start: 2015-05-27 — End: 2015-05-27
  Administered 2015-05-27: 0.5 mg via ORAL
  Filled 2015-05-27: qty 1

## 2015-05-27 NOTE — ED Notes (Signed)
Spoke with Matthew Client, MD - ok to send Ativan 0.5mg  PO dose home with patient due to not being able to obtain medication immediately from pharmacy. Patient reporting that he feels better at this time; has been sleeping. Medication and side effects reviewed with patient and spouse; tablet supplied to spouse per MD order. Patient pending discharge.

## 2015-05-27 NOTE — ED Notes (Addendum)
Patient presents to ED 3 from home. Patient reports that he woke up "not feeling right". Patient reporting that he feels a "hot" sensation to the RIGHT side of his face. Patient also reports that his RUE is "stiff". Denies numbness at present. Of note, patient with speech changes from a previous CVA, however he is at his baseline per spouse's report. Patient also was here on Sunday night and had a full workup (Blood, CT, and MRI) - advised to follow up with neuro. Patient presents back tonight having not attended any follow up appointments, nor having spoken with neuro citing that he has an appointment next week. This RN educated patient on importance of follow up especially given the emergence of his new symptoms. On exam, patient grossly NI with no focal neuro deficits identified (see neuro assessment). Patient with slight RIGHT side face droop; present from past CVA. PERRLA. No pronator drift appreciated; MAEW.

## 2015-05-27 NOTE — Discharge Instructions (Signed)
Paresthesia Paresthesia is an abnormal burning or prickling sensation. This sensation is generally felt in the hands, arms, legs, or feet. However, it may occur in any part of the body. Usually, it is not painful. The feeling may be described as:  Tingling or numbness.  Pins and needles.  Skin crawling.  Buzzing.  Limbs falling asleep.  Itching. Most people experience temporary (transient) paresthesia at some time in their lives. Paresthesia may occur when you breathe too quickly (hyperventilation). It can also occur without any apparent cause. Commonly, paresthesia occurs when pressure is placed on a nerve. The sensation quickly goes away after the pressure is removed. For some people, however, paresthesia is a long-lasting (chronic) condition that is caused by an underlying disorder. If you continue to have paresthesia, you may need further medical evaluation. HOME CARE INSTRUCTIONS Watch your condition for any changes. Taking the following actions may help to lessen any discomfort that you are feeling:  Avoid drinking alcohol.  Try acupuncture or massage to help relieve your symptoms.  Keep all follow-up visits as directed by your health care provider. This is important. SEEK MEDICAL CARE IF:  You continue to have episodes of paresthesia.  Your burning or prickling feeling gets worse when you walk.  You have pain, cramps, or dizziness.  You develop a rash. SEEK IMMEDIATE MEDICAL CARE IF:  You feel weak.  You have trouble walking or moving.  You have problems with speech, understanding, or vision.  You feel confused.  You cannot control your bladder or bowel movements.  You have numbness after an injury.  You faint.   This information is not intended to replace advice given to you by your health care provider. Make sure you discuss any questions you have with your health care provider.   Document Released: 05/07/2002 Document Revised: 10/01/2014 Document Reviewed:  05/13/2014 Elsevier Interactive Patient Education 2016 Elsevier Inc[employer]  

## 2015-05-27 NOTE — ED Notes (Addendum)
Patient ambulatory to triage with steady gait, without difficulty or distress noted; pt reports awoke at midnight feeling "hot"; seen Sunday for same and testing WNL; st hx CVA and "was worried"; st right arm feels "stiff" but denies any numbness or c/o pain at present

## 2015-05-27 NOTE — ED Notes (Addendum)
MD made aware that patient has been sleeping since her assessment. Ordered Ativan dose has NOT been given at this time due to patient being asleep. EDP returns to bedside to speak with patient. Will direct RN further on Ativan dose following conversation with patient.

## 2015-05-27 NOTE — ED Notes (Signed)

## 2015-05-27 NOTE — ED Provider Notes (Signed)
[facility] Emergency Department Provider Note  ____________________________________________  Time seen: Approximately 5:35 AM  I have reviewed the triage vital signs and the nursing notes.   HISTORY  Chief Complaint Numbness    HPI [name] is a 59 y.o. male who comes into the hospital with some facial numbness as well as arm numbness and tingling. The patient had a stroke 2 years ago when he was here 2 nights ago when he started feeling funny. The patient had evaluation with blood, CT and MRI which were unremarkable. The patient reports that tonight he had some feelings of dizziness and warmth to his face. His right arm started to feel heavy and he felt as though there was some numbness there. The patient reports that it started around midnight. He reports that as the time was passing he felt as though the numbness was spreading to his left arm and he became concerned so he decided to come into the hospital for evaluation. The patient also reports that some of the numbness spread to his left face. The patient reports that he could not sleep so he wanted to get checked out. The patient is scheduled to see his primary care physician on Wednesday. The patient also reports that he has an appointment with his neurologist next week.The patient reports that at this time all of the symptoms have resolved.   Past Medical History  Diagnosis Date  . Stroke ([place])   . Hypertension     There are no active problems to display for this patient.   No past surgical history   Current Outpatient Rx  Name  Route  Sig  Dispense  Refill  . amLODipine (NORVASC) 10 MG tablet   Oral   Take 10 mg by mouth daily.         Marland Kitchen aspirin 325 MG tablet   Oral   Take 325 mg by mouth daily.         Marland Kitchen atorvastatin (LIPITOR) 20 MG tablet   Oral   Take 40 mg by mouth daily.         . carvedilol (COREG) 6.25 MG tablet   Oral   Take 6.25 mg by mouth 2 (two) times daily with a  meal.         . hydrochlorothiazide (HYDRODIURIL) 25 MG tablet   Oral   Take 25 mg by mouth daily.         Marland Kitchen LORazepam (ATIVAN) 0.5 MG tablet   Oral   Take 1 tablet (0.5 mg total) by mouth every 8 (eight) hours as needed for anxiety.   7 tablet   0     Allergies Review of patient's allergies indicates no known allergies.  No family history on file.  Social History Social History  Substance Use Topics  . Smoking status: Never Smoker   . Smokeless tobacco: Not on file  . Alcohol Use: No    Review of Systems Constitutional: No fever/chills Eyes: No visual changes. ENT: No sore throat. Cardiovascular: Denies chest pain. Respiratory: Denies shortness of breath. Gastrointestinal: No abdominal pain.  No nausea, no vomiting.  No diarrhea.  No constipation. Genitourinary: Negative for dysuria. Musculoskeletal: Negative for back pain. Skin: Negative for rash. Neurological: Numbness to right sided face and arm, dizziness  10-point ROS otherwise negative.  ____________________________________________   PHYSICAL EXAM:  VITAL SIGNS: ED Triage Vitals  Enc Vitals Group     BP 05/27/15 0313 163/94 mmHg     Pulse Rate 05/27/15  0313 87     Resp 05/27/15 0313 18     Temp 05/27/15 0313 97.9 F (36.6 C)     Temp Source 05/27/15 0313 Oral     SpO2 05/27/15 0313 100 %     Weight 05/27/15 0313 213 lb (96.616 kg)     Height 05/27/15 0313 5\' 10"  (1.778 m)     Head Cir --      Peak Flow --      Pain Score --      Pain Loc --      Pain Edu? --      Excl. in [place]? --     Constitutional: Alert and oriented. Well appearing and in no acute distress. Eyes: Conjunctivae are normal. PERRL. EOMI. Head: Atraumatic. Nose: No congestion/rhinnorhea. Mouth/Throat: Mucous membranes are moist.  Oropharynx non-erythematous. Cardiovascular: Normal rate, regular rhythm. Grossly normal heart sounds.  Good peripheral circulation. Respiratory: Normal respiratory effort.  No retractions. Lungs  CTAB. Gastrointestinal: Soft and nontender. No distention. Positive bowel sounds Musculoskeletal: No lower extremity tenderness nor edema.   Neurologic:  Normal speech and language. Cranial nerves II through XII are grossly intact, no pronator drift, no ataxia with finger to nose, sensation intact throughout. Skin:  Skin is warm, dry and intact.  Psychiatric: Mood and affect are normal.   ____________________________________________   LABS (all labs ordered are listed, but only abnormal results are displayed)  Labs Reviewed - No data to display ____________________________________________  EKG  ED ECG REPORT I, Loney Hering, the attending physician, personally viewed and interpreted this ECG.   Date: 05/27/2015  EKG Time: 320  Rate: 77  Rhythm: normal sinus rhythm  Axis: normal  Intervals:none  ST&T Change: none  ____________________________________________  RADIOLOGY  None ____________________________________________   PROCEDURES  Procedure(s) performed: None  Critical Care performed: No  ____________________________________________   INITIAL IMPRESSION / ASSESSMENT AND PLAN / ED COURSE  Pertinent labs & imaging results that were available during my care of the patient were reviewed by me and considered in my medical decision making (see chart for details).  This is a 59 year old male with a history of stroke who comes in today with some facial and numbness as well as right arm numbness. The patient had similar symptoms 2 days ago and received a CT scan and MRI that was unremarkable. The patient also had unremarkable blood work. I do not feel at this time that the patient needs any further studies. I contacted neurology and spoke to Dr. Irish Elders who felt that the patient also did not need any further imaging studies as he recently had normal studies 2 days ago. He feels that the patient should follow-up with Dr. Manuella Ghazi his neurologist for further evaluation.  The patient already has been increased from 81 mg aspirin to 325 mg of aspirin. Neurology felt that the patient can be followed up as an outpatient for further evaluation of his symptoms. The patient was sleeping comfortably when I went back into the room I did give him a half milligram of Ativan to help with his symptoms. The patient will be discharged home. ____________________________________________   FINAL CLINICAL IMPRESSION(S) / ED DIAGNOSES  Final diagnoses:  Paresthesias  Numbness      Loney Hering, MD 05/27/15 [phone]

## 2015-08-26 DIAGNOSIS — Z8673 Personal history of transient ischemic attack (TIA), and cerebral infarction without residual deficits: Secondary | ICD-10-CM | POA: Insufficient documentation

## 2016-03-01 ENCOUNTER — Emergency Department: Payer: BC Managed Care – PPO

## 2016-03-01 ENCOUNTER — Encounter: Payer: Self-pay | Admitting: Emergency Medicine

## 2016-03-01 ENCOUNTER — Emergency Department
Admission: EM | Admit: 2016-03-01 | Discharge: 2016-03-01 | Disposition: A | Discharge status: Home / Self Care | Payer: BC Managed Care – PPO | Attending: Emergency Medicine | Admitting: Emergency Medicine

## 2016-03-01 DIAGNOSIS — I10 Essential (primary) hypertension: Secondary | ICD-10-CM

## 2016-03-01 DIAGNOSIS — R531 Weakness: Secondary | ICD-10-CM

## 2016-03-01 DIAGNOSIS — Z79899 Other long term (current) drug therapy: Secondary | ICD-10-CM | POA: Diagnosis not present

## 2016-03-01 DIAGNOSIS — Z7982 Long term (current) use of aspirin: Secondary | ICD-10-CM | POA: Diagnosis not present

## 2016-03-01 DIAGNOSIS — R51 Headache: Secondary | ICD-10-CM | POA: Diagnosis present

## 2016-03-01 LAB — CBC
HEMATOCRIT: 41.4 % (ref 40.0–52.0)
Hemoglobin: 14.6 g/dL (ref 13.0–18.0)
MCH: 31.7 pg (ref 26.0–34.0)
MCHC: 35.4 g/dL (ref 32.0–36.0)
MCV: 89.7 fL (ref 80.0–100.0)
PLATELETS: 281 10*3/uL (ref 150–440)
RBC: 4.62 MIL/uL (ref 4.40–5.90)
RDW: 13.6 % (ref 11.5–14.5)
WBC: 11.8 10*3/uL — AB (ref 3.8–10.6)

## 2016-03-01 LAB — BASIC METABOLIC PANEL
Anion gap: 7 (ref 5–15)
BUN: 17 mg/dL (ref 6–20)
CO2: 27 mmol/L (ref 22–32)
CREATININE: 1.11 mg/dL (ref 0.61–1.24)
Calcium: 9.5 mg/dL (ref 8.9–10.3)
Chloride: 102 mmol/L (ref 101–111)
Glucose, Bld: 132 mg/dL — ABNORMAL HIGH (ref 65–99)
POTASSIUM: 4.2 mmol/L (ref 3.5–5.1)
SODIUM: 136 mmol/L (ref 135–145)

## 2016-03-01 LAB — TROPONIN I: Troponin I: <0.03 ng/mL (ref <0.03)

## 2016-03-01 MED ORDER — CLOPIDOGREL BISULFATE 75 MG PO TABS: 75.0000 mg | ORAL_TABLET | Freq: Every day | ORAL | 0 refills | Status: DC

## 2016-03-01 NOTE — ED Provider Notes (Signed)
[facility] Emergency Department Provider Note   ____________________________________________   None    (approximate)  I have reviewed the triage vital signs and the nursing notes.   HISTORY  Chief Complaint Weakness and Headache    HPI [name] is a 60 y.o. male patient complain of left head pressure and right sided weakness. Patient onset was approximately 1600 hrs. yesterday. Patient state weakness impression increase around 1 AM this morning. Patient state his home blood pressure was elevated prior to his arrival. Patient has a past medical history of CVA in December 2016. Patient also has a history of blood pressure was hard to control. Patient states that he's been compliant this medicine this week. Patient denies any vertigo or vision disturbance. Patient denies any loss of bladder or bowel control. Patient denies any pain at this time. No palliative measures for his complaint. Patient was concerned of cessation of Plavix changes aspirin secondary to his CVA in December 2016. Patient has not discussed this concern with his PCP who discontinued the medication and Oriented here..   Past Medical History:  Diagnosis Date  . Hypertension   . Stroke [facility])     There are no active problems to display for this patient.   History reviewed. No pertinent surgical history.  Prior to Admission medications   Medication Sig Start Date End Date Taking? Authorizing Provider  gabapentin (NEURONTIN) 100 MG capsule Take 100 mg by mouth at bedtime.   Yes Historical Provider, MD  losartan (COZAAR) 100 MG tablet Take 100 mg by mouth daily.   Yes Historical Provider, MD  amLODipine (NORVASC) 10 MG tablet Take 10 mg by mouth daily.    Historical Provider, MD  aspirin 325 MG tablet Take 325 mg by mouth daily.    Historical Provider, MD  atorvastatin (LIPITOR) 20 MG tablet Take 40 mg by mouth daily.    Historical Provider, MD  carvedilol (COREG) 6.25 MG tablet Take  6.25 mg by mouth 2 (two) times daily with a meal.    Historical Provider, MD  hydrochlorothiazide (HYDRODIURIL) 25 MG tablet Take 25 mg by mouth daily.    Historical Provider, MD  LORazepam (ATIVAN) 0.5 MG tablet Take 1 tablet (0.5 mg total) by mouth every 8 (eight) hours as needed for anxiety. 05/27/15 05/26/16  Loney Hering, MD    Allergies Review of patient's allergies indicates no known allergies.  History reviewed. No pertinent family history.  Social History Social History  Substance Use Topics  . Smoking status: Never Smoker  . Smokeless tobacco: Never Used  . Alcohol use No    Review of Systems Constitutional: No fever/chills Eyes: No visual changes. ENT: No sore throat. Cardiovascular: Denies chest pain. Respiratory: Denies shortness of breath. Gastrointestinal: No abdominal pain.  No nausea, no vomiting.  No diarrhea.  No constipation. Genitourinary: Negative for dysuria. Musculoskeletal: Negative for back pain. Skin: Negative for rash. Neurological: Positive for headaches, with focal weakness on the right side which is now resolved. Endocrine:Hypertension  ____________________________________________   PHYSICAL EXAM:  VITAL SIGNS: ED Triage Vitals  Enc Vitals Group     BP 03/01/16 0319 (!) 177/84     Pulse Rate 03/01/16 0319 91     Resp 03/01/16 0319 18     Temp 03/01/16 0319 98.1 F (36.7 C)     Temp Source 03/01/16 0319 Oral     SpO2 03/01/16 0319 100 %     Weight 03/01/16 0320 212 lb (96.2 kg)  Height 03/01/16 0320 5\' 10"  (1.778 m)     Head Circumference --      Peak Flow --      Pain Score 03/01/16 0339 0     Pain Loc --      Pain Edu? --      Excl. in [place]? --     Constitutional: Alert and oriented. Well appearing and in no acute distress. Eyes: Conjunctivae are normal. PERRL. EOMI. Head: Atraumatic. Nose: No congestion/rhinnorhea. Mouth/Throat: Mucous membranes are moist.  Oropharynx non-erythematous. Neck: No stridor.  No cervical  spine tenderness to palpation. Hematological/Lymphatic/Immunilogical: No cervical lymphadenopathy. Cardiovascular: Normal rate, regular rhythm. Grossly normal heart sounds.  Good peripheral circulation. Elevated blood pressure. Respiratory: Normal respiratory effort.  No retractions. Lungs CTAB. Gastrointestinal: Soft and nontender. No distention. No abdominal bruits. No CVA tenderness. Musculoskeletal: No lower extremity tenderness nor edema.  No joint effusions. Patient was able to ambulate to the bathroom. Neurologic:  Normal speech and language. No gross focal neurologic deficits are appreciated. No gait instability. Skin:  Skin is warm, dry and intact. No rash noted. Psychiatric: Mood and affect are normal. Speech and behavior are normal.  ____________________________________________   LABS (all labs ordered are listed, but only abnormal results are displayed)  Labs Reviewed  BASIC METABOLIC PANEL - Abnormal; Notable for the following:       Result Value   Glucose, Bld 132 (*)    All other components within normal limits  CBC - Abnormal; Notable for the following:    WBC 11.8 (*)    All other components within normal limits  TROPONIN I  URINALYSIS COMPLETEWITH MICROSCOPIC ([facility] ONLY)   ____________________________________________  EKG  EKG read by heart station doctors showing normal sinus rhythm. ____________________________________________  RADIOLOGY  His CT today without contrast revealed no acute findings. ____________________________________________   PROCEDURES  Procedure(s) performed: None  Procedures  Critical Care performed: No  ____________________________________________   INITIAL IMPRESSION / ASSESSMENT AND PLAN / ED COURSE  Pertinent labs & imaging results that were available during my care of the patient were reviewed by me and considered in my medical decision making (see chart for details).  Resolved headache and right arm weakness.  Hypertension. Discussed lab results and CT of head revealing no acute findings.  Clinical Course  Physical exam was grossly unremarkable. Patient had no focal weakness or numbness at this time. Patient able to ambulate to the bathroom to give a urine sample returned without any complaint. Discussed patient with his primary care doctor who recommended he start Plavix 75 mg and have patient follow-up in one week with his clinic.  ____________________________________________   FINAL CLINICAL IMPRESSION(S) / ED DIAGNOSES  Final diagnoses:  Weakness  Essential hypertension      NEW MEDICATIONS STARTED DURING THIS VISIT:  New Prescriptions   No medications on file  Note:  This document was prepared using Dragon voice recognition software and may include unintentional dictation errors.    Sable Feil, PA-C 03/01/16 [address], PA-C 03/01/16 1035    Rudene Re, MD 03/02/16 [phone]

## 2016-03-01 NOTE — ED Triage Notes (Signed)
Pt presents to ED with c/o head pressure and right arm weakness, states onset around 1600 last night. No weakness noted at this time. Pt states BP elevated at home 181/107.

## 2016-03-01 NOTE — ED Notes (Signed)
See triage note  States he has had a stroke in the past which affected the right side. But noticed some right arm weakness yesterday afternoon with headache  States arm is better  And headache has eased up

## 2016-12-18 DIAGNOSIS — R0609 Other forms of dyspnea: Secondary | ICD-10-CM | POA: Diagnosis present

## 2016-12-18 DIAGNOSIS — Z79899 Other long term (current) drug therapy: Secondary | ICD-10-CM | POA: Diagnosis not present

## 2016-12-18 DIAGNOSIS — Z8673 Personal history of transient ischemic attack (TIA), and cerebral infarction without residual deficits: Secondary | ICD-10-CM | POA: Diagnosis not present

## 2016-12-18 DIAGNOSIS — I129 Hypertensive chronic kidney disease with stage 1 through stage 4 chronic kidney disease, or unspecified chronic kidney disease: Secondary | ICD-10-CM | POA: Diagnosis not present

## 2016-12-18 DIAGNOSIS — R0789 Other chest pain: Principal | ICD-10-CM | POA: Insufficient documentation

## 2016-12-18 DIAGNOSIS — N182 Chronic kidney disease, stage 2 (mild): Secondary | ICD-10-CM | POA: Diagnosis not present

## 2016-12-18 DIAGNOSIS — I200 Unstable angina: Secondary | ICD-10-CM | POA: Diagnosis present

## 2016-12-18 DIAGNOSIS — Z7982 Long term (current) use of aspirin: Secondary | ICD-10-CM | POA: Insufficient documentation

## 2016-12-18 DIAGNOSIS — I700 Atherosclerosis of aorta: Secondary | ICD-10-CM | POA: Diagnosis not present

## 2016-12-19 ENCOUNTER — Emergency Department: Payer: BC Managed Care – PPO

## 2016-12-19 ENCOUNTER — Encounter: Payer: Self-pay | Admitting: Emergency Medicine

## 2016-12-19 ENCOUNTER — Observation Stay
Admit: 2016-12-19 | Discharge: 2016-12-19 | Disposition: A | Discharge status: Home / Self Care | Payer: BC Managed Care – PPO | Attending: Internal Medicine | Admitting: Internal Medicine

## 2016-12-19 ENCOUNTER — Observation Stay
Admission: EM | Admit: 2016-12-19 | Discharge: 2016-12-19 | Disposition: A | Discharge status: Home / Self Care | Payer: BC Managed Care – PPO | Attending: Internal Medicine | Admitting: Internal Medicine

## 2016-12-19 DIAGNOSIS — R0789 Other chest pain: Secondary | ICD-10-CM

## 2016-12-19 DIAGNOSIS — N182 Chronic kidney disease, stage 2 (mild): Secondary | ICD-10-CM

## 2016-12-19 DIAGNOSIS — R0609 Other forms of dyspnea: Secondary | ICD-10-CM

## 2016-12-19 DIAGNOSIS — D72829 Elevated white blood cell count, unspecified: Secondary | ICD-10-CM

## 2016-12-19 DIAGNOSIS — I10 Essential (primary) hypertension: Secondary | ICD-10-CM

## 2016-12-19 DIAGNOSIS — R079 Chest pain, unspecified: Secondary | ICD-10-CM

## 2016-12-19 DIAGNOSIS — I200 Unstable angina: Secondary | ICD-10-CM

## 2016-12-19 LAB — BASIC METABOLIC PANEL WITH GFR
Anion gap: 11 (ref 5–15)
BUN: 21 mg/dL — ABNORMAL HIGH (ref 6–20)
CO2: 25 mmol/L (ref 22–32)
Calcium: 9.9 mg/dL (ref 8.9–10.3)
Chloride: 100 mmol/L — ABNORMAL LOW (ref 101–111)
Creatinine, Ser: 1.27 mg/dL — ABNORMAL HIGH (ref 0.61–1.24)
GFR calc Af Amer: >60 mL/min
GFR calc non Af Amer: 59 mL/min — ABNORMAL LOW
Glucose, Bld: 126 mg/dL — ABNORMAL HIGH (ref 65–99)
Potassium: 4 mmol/L (ref 3.5–5.1)
Sodium: 136 mmol/L (ref 135–145)

## 2016-12-19 LAB — CBC
HCT: 38.4 % — ABNORMAL LOW (ref 40.0–52.0)
HEMATOCRIT: 38.4 % — AB (ref 40.0–52.0)
Hemoglobin: 13.5 g/dL (ref 13.0–18.0)
Hemoglobin: 13.7 g/dL (ref 13.0–18.0)
MCH: 31.5 pg (ref 26.0–34.0)
MCH: 31.8 pg (ref 26.0–34.0)
MCHC: 35 g/dL (ref 32.0–36.0)
MCHC: 35.6 g/dL (ref 32.0–36.0)
MCV: 89.4 fL (ref 80.0–100.0)
MCV: 89.9 fL (ref 80.0–100.0)
Platelets: 299 10*3/uL (ref 150–440)
Platelets: 304 K/uL (ref 150–440)
RBC: 4.27 MIL/uL — AB (ref 4.40–5.90)
RBC: 4.3 MIL/uL — ABNORMAL LOW (ref 4.40–5.90)
RDW: 13.3 % (ref 11.5–14.5)
RDW: 13.7 % (ref 11.5–14.5)
WBC: 11.1 10*3/uL — AB (ref 3.8–10.6)
WBC: 11.8 K/uL — ABNORMAL HIGH (ref 3.8–10.6)

## 2016-12-19 LAB — TROPONIN I: Troponin I: <0.03 ng/mL

## 2016-12-19 LAB — CREATININE, SERUM
Creatinine, Ser: 1.46 mg/dL — ABNORMAL HIGH (ref 0.61–1.24)
GFR calc non Af Amer: 50 mL/min — ABNORMAL LOW (ref >60)
GFR, EST AFRICAN AMERICAN: 58 mL/min — AB (ref >60)

## 2016-12-19 MED ORDER — SODIUM CHLORIDE 0.9% FLUSH: 3.0000 mL | INTRAVENOUS | Status: DC | PRN

## 2016-12-19 MED ORDER — SUCRALFATE 1 G PO TABS: 1.0000 g | ORAL_TABLET | Freq: Three times a day (TID) | ORAL | 1 refills | Status: DC | PRN

## 2016-12-19 MED ORDER — GI COCKTAIL ~~LOC~~
30.0000 mL | ORAL | Status: AC
  Administered 2016-12-19: 30 mL via ORAL
  Filled 2016-12-19: qty 30

## 2016-12-19 MED ORDER — NITROGLYCERIN 0.4 MG SL SUBL: 0.4000 mg | SUBLINGUAL_TABLET | SUBLINGUAL | 12 refills | Status: DC | PRN

## 2016-12-19 MED ORDER — ASPIRIN 81 MG PO CHEW
324.0000 mg | CHEWABLE_TABLET | ORAL | Status: AC
  Administered 2016-12-19: 324 mg via ORAL
  Filled 2016-12-19: qty 4

## 2016-12-19 MED ORDER — NITROGLYCERIN 2 % TD OINT
1.0000 [in_us] | TOPICAL_OINTMENT | Freq: Once | TRANSDERMAL | Status: AC
  Administered 2016-12-19: 1 [in_us] via TOPICAL
  Filled 2016-12-19: qty 1

## 2016-12-19 MED ORDER — NITROGLYCERIN 0.4 MG SL SUBL: 0.4000 mg | SUBLINGUAL_TABLET | SUBLINGUAL | Status: DC | PRN

## 2016-12-19 MED ORDER — ACETAMINOPHEN 325 MG PO TABS: 650.0000 mg | ORAL_TABLET | ORAL | Status: DC | PRN

## 2016-12-19 MED ORDER — HYDROCHLOROTHIAZIDE 12.5 MG PO CAPS
12.5000 mg | ORAL_CAPSULE | Freq: Every day | ORAL | Status: DC
  Administered 2016-12-19: 12.5 mg via ORAL
  Filled 2016-12-19: qty 1

## 2016-12-19 MED ORDER — SODIUM CHLORIDE 0.9 % IV SOLN: 250.0000 mL | INTRAVENOUS | Status: DC | PRN

## 2016-12-19 MED ORDER — ASPIRIN EC 81 MG PO TBEC: 81.0000 mg | DELAYED_RELEASE_TABLET | Freq: Every day | ORAL | Status: DC

## 2016-12-19 MED ORDER — SODIUM CHLORIDE 0.9% FLUSH
3.0000 mL | Freq: Two times a day (BID) | INTRAVENOUS | Status: DC
  Administered 2016-12-19: 3 mL via INTRAVENOUS

## 2016-12-19 MED ORDER — ASPIRIN 81 MG PO CHEW
324.0000 mg | CHEWABLE_TABLET | Freq: Once | ORAL | Status: AC
  Administered 2016-12-19: 324 mg via ORAL
  Filled 2016-12-19: qty 4

## 2016-12-19 MED ORDER — ATORVASTATIN CALCIUM 20 MG PO TABS: 40.0000 mg | ORAL_TABLET | Freq: Every day | ORAL | Status: DC

## 2016-12-19 MED ORDER — ONDANSETRON HCL 4 MG/2ML IJ SOLN: 4.0000 mg | Freq: Four times a day (QID) | INTRAMUSCULAR | Status: DC | PRN

## 2016-12-19 MED ORDER — ASPIRIN 300 MG RE SUPP: 300.0000 mg | RECTAL | Status: AC

## 2016-12-19 MED ORDER — PANTOPRAZOLE SODIUM 40 MG PO TBEC: 40.0000 mg | DELAYED_RELEASE_TABLET | Freq: Every day | ORAL | 1 refills | Status: DC

## 2016-12-19 MED ORDER — GABAPENTIN 100 MG PO CAPS: 100.0000 mg | ORAL_CAPSULE | Freq: Every day | ORAL | Status: DC

## 2016-12-19 MED ORDER — SUCRALFATE 1 GM/10ML PO SUSP
1.0000 g | Freq: Three times a day (TID) | ORAL | Status: DC
  Administered 2016-12-19: 1 g via ORAL
  Filled 2016-12-19 (×3): qty 10

## 2016-12-19 MED ORDER — CARVEDILOL 3.125 MG PO TABS
3.1250 mg | ORAL_TABLET | Freq: Two times a day (BID) | ORAL | Status: DC
  Administered 2016-12-19: 3.125 mg via ORAL
  Filled 2016-12-19: qty 1

## 2016-12-19 MED ORDER — ENOXAPARIN SODIUM 40 MG/0.4ML ~~LOC~~ SOLN
40.0000 mg | SUBCUTANEOUS | Status: DC
  Administered 2016-12-19: 40 mg via SUBCUTANEOUS
  Filled 2016-12-19: qty 0.4

## 2016-12-19 MED ORDER — LOSARTAN POTASSIUM-HCTZ 100-12.5 MG PO TABS: 1.0000 | ORAL_TABLET | Freq: Every day | ORAL | Status: DC

## 2016-12-19 MED ORDER — LOSARTAN POTASSIUM 50 MG PO TABS
100.0000 mg | ORAL_TABLET | Freq: Every day | ORAL | Status: DC
  Administered 2016-12-19: 100 mg via ORAL
  Filled 2016-12-19: qty 2

## 2016-12-19 NOTE — ED Triage Notes (Signed)
Pt c/o left sided chest pain intermittently for 3 days; burning sensation; earlier today he had pain in both arms and neck after ambulating; reports short of breath after walking short distances; "I get tired easily"; pt awake and alert, answering questions without difficulty

## 2016-12-19 NOTE — ED Notes (Signed)
[name] called from 2A with telemetry box available. Pt being transported to floor at this time.

## 2016-12-19 NOTE — Progress Notes (Signed)
During rounding with cardiologist pt states he has burning in his chest. Verbal order for GI cocktail received. I will continue to assess.

## 2016-12-19 NOTE — H&P (Signed)
[place] [facility] [place] NAME: [name]    MR#:  [id number]  DATE OF BIRTH:  [birth year]  DATE OF ADMISSION:  12/19/2016  PRIMARY CARE PHYSICIAN: Kirk Ruths, MD   REQUESTING/REFERRING PHYSICIAN:   CHIEF COMPLAINT:   Chief Complaint  Patient presents with  . Chest Pain    HISTORY OF PRESENT ILLNESS: [name]  is a 61 y.o. male with a known history of Hypertension, CVA presented to the emergency room with chest pain. The chest discomfort was started 2 days ago and was was pressure like sensation 4 out of 10 on a scale of 1-10. Patient was evaluated in the emergency room first set of troponin was negative EKG normal sinus rhythm with no ST segment changes. Patient never had any cardiac stress test done in the past. Hospitalist service was consulted.  PAST MEDICAL HISTORY:   Past Medical History:  Diagnosis Date  . Hypertension   . Stroke [facility])     PAST SURGICAL HISTORY: Past Surgical History:  Procedure Laterality Date  . none      SOCIAL HISTORY:  Social History  Substance Use Topics  . Smoking status: Never Smoker  . Smokeless tobacco: Never Used  . Alcohol use No    FAMILY HISTORY:  Family History  Problem Relation Age of Onset  . Hypertension Mother   . Hypertension Father     DRUG ALLERGIES: No Known Allergies  REVIEW OF SYSTEMS:   CONSTITUTIONAL: No fever, fatigue or weakness.  EYES: No blurred or double vision.  EARS, NOSE, AND THROAT: No tinnitus or ear pain.  RESPIRATORY: No cough, shortness of breath, wheezing or hemoptysis.  CARDIOVASCULAR: Has chest pain,  No orthopnea, edema.  GASTROINTESTINAL: No nausea, vomiting, diarrhea or abdominal pain.  GENITOURINARY: No dysuria, hematuria.  ENDOCRINE: No polyuria, nocturia,  HEMATOLOGY: No anemia, easy bruising or bleeding SKIN: No rash or lesion. MUSCULOSKELETAL: No joint pain or arthritis.   NEUROLOGIC: No tingling, numbness, weakness.   PSYCHIATRY: No anxiety or depression.   MEDICATIONS AT HOME:  Prior to Admission medications   Medication Sig Start Date End Date Taking? Authorizing Provider  amLODipine (NORVASC) 10 MG tablet Take 10 mg by mouth daily.   Yes [provider]  aspirin EC 81 MG tablet Take 81 mg by mouth daily.   Yes [provider]  atorvastatin (LIPITOR) 20 MG tablet Take 40 mg by mouth daily.   Yes [provider]  carvedilol (COREG) 6.25 MG tablet Take 3.125 mg by mouth 2 (two) times daily with a meal.    Yes [provider]  gabapentin (NEURONTIN) 100 MG capsule Take 100 mg by mouth at bedtime.   Yes [provider]  losartan-hydrochlorothiazide (HYZAAR) 100-12.5 MG tablet Take 1 tablet by mouth daily.   Yes [provider]      PHYSICAL EXAMINATION:   VITAL SIGNS: Blood pressure (!) 152/89, pulse 75, temperature 98.7 F (37.1 C), temperature source Oral, resp. rate 18, height 5\' 10"  (1.778 m), weight 95.7 kg (211 lb), SpO2 99 %.  GENERAL:  61 y.o.-year-old patient lying in the bed with no acute distress.  EYES: Pupils equal, round, reactive to light and accommodation. No scleral icterus. Extraocular muscles intact.  HEENT: Head atraumatic, normocephalic. Oropharynx and nasopharynx clear.  NECK:  Supple, no jugular venous distention. No thyroid enlargement, no tenderness.  LUNGS: Normal breath sounds bilaterally, no wheezing, rales,rhonchi or crepitation. No use of accessory muscles of respiration.  CARDIOVASCULAR: S1, S2 normal. No murmurs, rubs, or gallops.  ABDOMEN: Soft, nontender, nondistended. Bowel sounds present. No organomegaly or mass.  EXTREMITIES: No pedal edema, cyanosis, or clubbing.  NEUROLOGIC: Cranial nerves II through XII are intact. Muscle strength 5/5 in all extremities. Sensation intact. Gait not checked.  PSYCHIATRIC: The patient is alert and oriented x 3.  SKIN: No obvious rash, lesion, or ulcer.   LABORATORY PANEL:    CBC  Recent Labs Lab 12/19/16 0002  WBC 11.8*  HGB 13.7  HCT 38.4*  PLT 304  MCV 89.4  MCH 31.8  MCHC 35.6  RDW 13.3   ------------------------------------------------------------------------------------------------------------------  Chemistries   Recent Labs Lab 12/19/16 0002  NA 136  K 4.0  CL 100*  CO2 25  GLUCOSE 126*  BUN 21*  CREATININE 1.27*  CALCIUM 9.9   ------------------------------------------------------------------------------------------------------------------ estimated creatinine clearance is 70 mL/min (A) (by C-G formula based on SCr of 1.27 mg/dL (H)). ------------------------------------------------------------------------------------------------------------------ No results for input(s): TSH, T4TOTAL, T3FREE, THYROIDAB in the last 72 hours.  Invalid input(s): FREET3   Coagulation profile No results for input(s): INR, PROTIME in the last 168 hours. ------------------------------------------------------------------------------------------------------------------- No results for input(s): DDIMER in the last 72 hours. -------------------------------------------------------------------------------------------------------------------  Cardiac Enzymes  Recent Labs Lab 12/19/16 0002  TROPONINI <0.03   ------------------------------------------------------------------------------------------------------------------ Invalid input(s): POCBNP  ---------------------------------------------------------------------------------------------------------------  Urinalysis No results found for: COLORURINE, APPEARANCEUR, LABSPEC, PHURINE, GLUCOSEU, HGBUR, BILIRUBINUR, KETONESUR, PROTEINUR, UROBILINOGEN, NITRITE, LEUKOCYTESUR   RADIOLOGY: Dg Chest 2 View  Result Date: 12/19/2016 CLINICAL DATA:  Chest pain EXAM: CHEST  2 VIEW COMPARISON:  07/22/2013 FINDINGS: Minimal atelectasis at the left lung base. No pleural effusion. Normal heart size. Aortic  atherosclerosis. No pneumothorax. Degenerative changes of the spine. IMPRESSION: Minimal atelectasis at the left base. Electronically Signed   By: Donavan Foil M.D.   On: 12/19/2016 00:29    EKG: Orders placed or performed during the hospital encounter of 12/19/16  . EKG 12-Lead  . EKG 12-Lead  . ED EKG within 10 minutes  . ED EKG within 10 minutes    IMPRESSION AND PLAN: 61 year old male patient with history of hypertension, CVA presented to the emergency room with chest pain. Admitting diagnosis 1. Chest pain 2. Hypertension 3. History of CVA Treatment plan Admit patient to telemetry observation bed Cycle troponin Check echocardiogram Aspirin 81 mg daily Restart beta blocker Cardiology consult Cardiac stress test to assess for any ischemia  All the records are reviewed and case discussed with ED provider. Management plans discussed with the patient, family and they are in agreement.  CODE STATUS:FULL CODE Code Status History    This patient does not have a recorded code status. Please follow your organizational policy for patients in this situation.    Advance Directive Documentation     Most Recent Value  Type of Advance Directive  Living will, Healthcare Power of Attorney  Pre-existing out of facility DNR order (yellow form or pink MOST form)  -  "MOST" Form in Place?  -       TOTAL TIME TAKING CARE OF THIS PATIENT: 50 minutes.    Saundra Shelling M.D on 12/19/2016 at 5:27 AM  Between 7am to 6pm - Pager - [phone]  After 6pm go to [email or web address] - password [email or web address] [facility]  [facility] [facility]  Office  [phone]  CC: Primary care physician; Kirk Ruths, MD

## 2016-12-19 NOTE — Progress Notes (Signed)
Pt. Arrived via stretcher transferred to bed with standby assist. A&O x4, General room orientation given. Instruction on how to use ascom and call bell system given. Fall sheet signed. Skin warm, clean, dry and intact. Assessed with Baldo Ash, RN. Tele boxed verified with CCMD by Baldo Ash, RN and Merry Proud CNA. Asymmetrical facial features and  grip to right hand weak related to old CVA.

## 2016-12-19 NOTE — Consult Note (Signed)
[facility] Cardiology  CARDIOLOGY CONSULT NOTE  Patient ID: [name] MRN: [id number] DOB/AGE: 1956-05-25 61 y.o.  Admit date: 12/19/2016 Referring Physician Ether Griffins Primary Physician [facility] Primary Cardiologist Nehemiah Massed Reason for Consultation Chest pain  HPI: 61 year old gentleman referred for evaluation of chest pain. 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 Present with a one-week history of intermittent episodes of chest pain, described as pressure-like sensation, associated with shortness of breath and burning. Initial ECG revealed normal sinus rhythm normal ECG. Admission labs were notable for negative troponin less than 0.03 2.  Review of systems complete and found to be negative unless listed above     Past Medical History:  Diagnosis Date  . Hypertension   . Stroke [facility])     Past Surgical History:  Procedure Laterality Date  . none      Prescriptions Prior to Admission  Medication Sig Dispense Refill Last Dose  . amLODipine (NORVASC) 10 MG tablet Take 10 mg by mouth daily.   05/26/2015 at 1730  . aspirin EC 81 MG tablet Take 81 mg by mouth daily.     Marland Kitchen atorvastatin (LIPITOR) 20 MG tablet Take 40 mg by mouth daily.   05/26/2015 at 1730  . carvedilol (COREG) 6.25 MG tablet Take 3.125 mg by mouth 2 (two) times daily with a meal.    05/26/2015 at 1730  . gabapentin (NEURONTIN) 100 MG capsule Take 100 mg by mouth at bedtime.     Marland Kitchen losartan-hydrochlorothiazide (HYZAAR) 100-12.5 MG tablet Take 1 tablet by mouth daily.      Social History   Social History  . Marital status: Married    Spouse name: N/A  . Number of children: N/A  . Years of education: N/A   Occupational History  . retired    Social History Main Topics  . Smoking status: Never Smoker  . Smokeless tobacco: Never Used  . Alcohol use No  . Drug use: No  . Sexual activity: Not on file   Other Topics Concern  . Not on file   Social History Narrative  . No narrative on file    Family History   Problem Relation Age of Onset  . Hypertension Mother   . Hypertension Father       Review of systems complete and found to be negative unless listed above      PHYSICAL EXAM  General: Well developed, well nourished, in no acute distress HEENT:  Normocephalic and atramatic Neck:  No JVD.  Lungs: Clear bilaterally to auscultation and percussion. Heart: HRRR . Normal S1 and S2 without gallops or murmurs.  Abdomen: Bowel sounds are positive, abdomen soft and non-tender  Msk:  Back normal, normal gait. Normal strength and tone for age. Extremities: No clubbing, cyanosis or edema.   Neuro: Alert and oriented X 3. Psych:  Good affect, responds appropriately  Labs:   Lab Results  Component Value Date   WBC 11.1 (H) 12/19/2016   HGB 13.5 12/19/2016   HCT 38.4 (L) 12/19/2016   MCV 89.9 12/19/2016   PLT 299 12/19/2016    Recent Labs Lab 12/19/16 0002 12/19/16 0644  NA 136  --   K 4.0  --   CL 100*  --   CO2 25  --   BUN 21*  --   CREATININE 1.27* 1.46*  CALCIUM 9.9  --   GLUCOSE 126*  --    Lab Results  Component Value Date   CKTOTAL 246 07/23/2013   CKMB 1.5 07/23/2013   TROPONINI <  0.03 12/19/2016    Lab Results  Component Value Date   CHOL 240 (H) 07/23/2013   Lab Results  Component Value Date   HDL 29 (L) 07/23/2013   Lab Results  Component Value Date   LDLCALC 162 (H) 07/23/2013   Lab Results  Component Value Date   TRIG 245 (H) 07/23/2013   No results found for: CHOLHDL No results found for: LDLDIRECT    Radiology: Dg Chest 2 View  Result Date: 12/19/2016 CLINICAL DATA:  Chest pain EXAM: CHEST  2 VIEW COMPARISON:  07/22/2013 FINDINGS: Minimal atelectasis at the left lung base. No pleural effusion. Normal heart size. Aortic atherosclerosis. No pneumothorax. Degenerative changes of the spine. IMPRESSION: Minimal atelectasis at the left base. Electronically Signed   By: Donavan Foil M.D.   On: 12/19/2016 00:29    EKG: Normal sinus rhythm normal  ECG  ASSESSMENT AND PLAN:   1. Chest pain, with typical and atypical features, with normal ECG, negative troponin  Recommendations  1. Agree with current therapy 2. Defer full dose anticoagulation 3. ETT Myoview and a.m. The patient ambulate today, feels well without recurrent chest pain, possibly could be discharged home for outpatient ETT Myoview.  Signed: Isaias Cowman MD,PhD, [facility] 12/19/2016, 10:32 AM

## 2016-12-19 NOTE — Plan of Care (Signed)
Problem: Safety: Goal: Ability to remain free from injury will improve Outcome: Completed/Met Date Met: 12/19/16 Pt is a mod fall risk, steady on feet, encouraged to call for assistance with activity as needed.    

## 2016-12-19 NOTE — ED Notes (Signed)
Gave reports to [place] on 2A. Per Lattie Haw [facility] looking for another telemetry box for monitoring. Lattie Haw will call down to ED once box obtained.

## 2016-12-19 NOTE — ED Provider Notes (Signed)
[facility] Emergency Department Provider Note   ____________________________________________   First MD Initiated Contact with Patient 12/19/16 0407     (approximate)  I have reviewed the triage vital signs and the nursing notes.   HISTORY  Chief Complaint Chest Pain    HPI [name] is a 61 y.o. male presents to the ED from home with a chief complaint of chest pain. Patient has a history of hypertension, CVA with residual right-sided deficits, who complains of intermittent left-sided chest pressure and burning sensation for the past 3 days. Increased in intensity tonight which is what brings him to the emergency department for evaluation. Reports dyspnea on exertion with left chest pain radiating to both arms and neck.Denies associated fever, chills, cough, congestion, abdominal pain, nausea, vomiting, diaphoresis. Denies recent travel or trauma. Nothing makes his symptoms better or worse.   Past Medical History:  Diagnosis Date  . Hypertension   . Stroke [facility])     There are no active problems to display for this patient.   History reviewed. No pertinent surgical history.  Prior to Admission medications   Medication Sig Start Date End Date Taking? Authorizing Provider  amLODipine (NORVASC) 10 MG tablet Take 10 mg by mouth daily.   Yes [provider]  aspirin EC 81 MG tablet Take 81 mg by mouth daily.   Yes [provider]  atorvastatin (LIPITOR) 20 MG tablet Take 40 mg by mouth daily.   Yes [provider]  carvedilol (COREG) 6.25 MG tablet Take 3.125 mg by mouth 2 (two) times daily with a meal.    Yes [provider]  gabapentin (NEURONTIN) 100 MG capsule Take 100 mg by mouth at bedtime.   Yes [provider]  losartan-hydrochlorothiazide (HYZAAR) 100-12.5 MG tablet Take 1 tablet by mouth daily.   Yes [provider]    Allergies Patient has no known allergies.  History reviewed. No  pertinent family history.  Social History Social History  Substance Use Topics  . Smoking status: Never Smoker  . Smokeless tobacco: Never Used  . Alcohol use No    Review of Systems  Constitutional: No fever/chills. Eyes: No visual changes. ENT: No sore throat. Cardiovascular: Positive for chest pain. Respiratory: Positive for shortness of breath. Gastrointestinal: No abdominal pain.  No nausea, no vomiting.  No diarrhea.  No constipation. Genitourinary: Negative for dysuria. Musculoskeletal: Negative for back pain. Skin: Negative for rash. Neurological: Negative for headaches, focal weakness or numbness.   ____________________________________________   PHYSICAL EXAM:  VITAL SIGNS: ED Triage Vitals [12/18/16 2353]  Enc Vitals Group     BP (!) 180/92     Pulse Rate 83     Resp 18     Temp 98.7 F (37.1 C)     Temp Source Oral     SpO2 100 %     Weight 211 lb (95.7 kg)     Height 5\' 10"  (1.778 m)     Head Circumference      Peak Flow      Pain Score 0     Pain Loc      Pain Edu?      Excl. in [place]?     Constitutional: Alert and oriented. Well appearing and in no acute distress. Eyes: Conjunctivae are normal. PERRL. EOMI. Head: Atraumatic. Nose: No congestion/rhinnorhea. Mouth/Throat: Mucous membranes are moist.  Oropharynx non-erythematous. Neck: No stridor.  No carotid bruits. Cardiovascular: Normal rate, regular rhythm. Grossly normal heart sounds.  Good  peripheral circulation. Respiratory: Normal respiratory effort.  No retractions. Lungs CTAB. Gastrointestinal: Soft and nontender. No distention. No abdominal bruits. No CVA tenderness. Musculoskeletal: No lower extremity tenderness nor edema.  No joint effusions. Neurologic:  Normal speech and language. No gross focal neurologic deficits are appreciated. Baseline right facial droop. Skin:  Skin is warm, dry and intact. No rash noted. Psychiatric: Mood and affect are normal. Speech and behavior are  normal.  ____________________________________________   LABS (all labs ordered are listed, but only abnormal results are displayed)  Labs Reviewed  BASIC METABOLIC PANEL - Abnormal; Notable for the following:       Result Value   Chloride 100 (*)    Glucose, Bld 126 (*)    BUN 21 (*)    Creatinine, Ser 1.27 (*)    GFR calc non Af Amer 59 (*)    All other components within normal limits  CBC - Abnormal; Notable for the following:    WBC 11.8 (*)    RBC 4.30 (*)    HCT 38.4 (*)    All other components within normal limits  TROPONIN I   ____________________________________________  EKG  ED ECG REPORT I, SUNG,JADE J, the attending physician, personally viewed and interpreted this ECG.   Date: 12/19/2016  EKG Time: 2349  Rate: 86  Rhythm: normal EKG, normal sinus rhythm  Axis: Normal  Intervals:none  ST&T Change: Nonspecific  ____________________________________________  RADIOLOGY  Dg Chest 2 View  Result Date: 12/19/2016 CLINICAL DATA:  Chest pain EXAM: CHEST  2 VIEW COMPARISON:  07/22/2013 FINDINGS: Minimal atelectasis at the left lung base. No pleural effusion. Normal heart size. Aortic atherosclerosis. No pneumothorax. Degenerative changes of the spine. IMPRESSION: Minimal atelectasis at the left base. Electronically Signed   By: Donavan Foil M.D.   On: 12/19/2016 00:29    ____________________________________________   PROCEDURES  Procedure(s) performed: None  Procedures  Critical Care performed: No  ____________________________________________   INITIAL IMPRESSION / ASSESSMENT AND PLAN / ED COURSE  Pertinent labs & imaging results that were available during my care of the patient were reviewed by me and considered in my medical decision making (see chart for details).  61 year old male with hypertension and history of CVA who presents with left-sided chest pain and dyspnea on exertion. Initial troponin and EKG are unremarkable. Slight elevation of  creatinine. Patient currently chest pain-free at this time. Will initiate aspirin therapy, nitroglycerin paste and discuss with hospitalist to evaluate patient in the emergency department for admission.      ____________________________________________   FINAL CLINICAL IMPRESSION(S) / ED DIAGNOSES  Final diagnoses:  Chest pain, unspecified type  Unstable angina (HCC)  DOE (dyspnea on exertion)      NEW MEDICATIONS STARTED DURING THIS VISIT:  New Prescriptions   No medications on file     Note:  This document was prepared using Dragon voice recognition software and may include unintentional dictation errors.    Paulette Blanch, MD 12/19/16 [phone]

## 2016-12-19 NOTE — Discharge Summary (Signed)
[name]    MR#:  [id number]  DATE OF BIRTH:  06/13/1955  DATE OF ADMISSION:  12/19/2016 ADMITTING PHYSICIAN: Matthew Shelling, MD  DATE OF DISCHARGE: No discharge date for patient encounter.  PRIMARY CARE PHYSICIAN: Matthew Ruths, MD     ADMISSION DIAGNOSIS:  Unstable angina ([place]) [I20.0] Chest discomfort [R07.89] DOE (dyspnea on exertion) [R06.09] Chest pain, unspecified type [R07.9]  DISCHARGE DIAGNOSIS:  Active Problems:   Chest pain   Essential hypertension   CKD (chronic kidney disease), stage II   Leukocytosis   SECONDARY DIAGNOSIS:   Past Medical History:  Diagnosis Date  . Hypertension   . Stroke ([place])     .pro HOSPITAL COURSE:   Patient is 61 year old Caucasian male with past medical history significant for history of hypertension, stroke, who presents to the hospital with complaints of chest pain, which started about 2 days ago, worse interval period of time, not associated with exertion. Patient was admitted to the hospital, his cardiac enzymes were cycled and they were normal. EKG showed no acute ST-T changes. Patient was seen by cardiologist, who recommended maybe stress test as outpatient or inpatient. If continues to have pains. Patient was initiated on GI cocktail, Carafate, and his chest pain improved. He felt comfortable to be discharged home with cardiology follow-up as outpatient. Discussion by problem: #1. Chest pain of unclear etiology at this time, echocardiogram is performed, pending results, patient will need a Myoview stress test as outpatient, discussed with [name] today, patient's pain has improved with gastrointestinal medications, including GI cocktail and Carafate, continue patient on Protonix and Carafate at home, continue aspirin, Coreg, statin as outpatient #2. Hypertension, well-controlled on current medications, continue outpatient meds #3  CKD, stage II, stable, follow as outpatient #4. Leukocytosis, no obvious infection, likely stress related, recheck as outpatient DISCHARGE CONDITIONS:   stable  CONSULTS OBTAINED:  Treatment Team:  Matthew Skains, MD Matthew Cowman, MD  DRUG ALLERGIES:  No Known Allergies  DISCHARGE MEDICATIONS:   Current Discharge Medication List    START taking these medications   Details  nitroGLYCERIN (NITROSTAT) 0.4 MG SL tablet Place 1 tablet (0.4 mg total) under the tongue every 5 (five) minutes x 3 doses as needed for chest pain. Qty: 30 tablet, Refills: 12    pantoprazole (PROTONIX) 40 MG tablet Take 1 tablet (40 mg total) by mouth daily. Qty: 30 tablet, Refills: 1    sucralfate (CARAFATE) 1 g tablet Take 1 tablet (1 g total) by mouth 3 (three) times daily as needed. Qty: 60 tablet, Refills: 1      CONTINUE these medications which have NOT CHANGED   Details  amLODipine (NORVASC) 10 MG tablet Take 10 mg by mouth daily.    aspirin EC 81 MG tablet Take 81 mg by mouth daily.    atorvastatin (LIPITOR) 20 MG tablet Take 40 mg by mouth daily.    carvedilol (COREG) 6.25 MG tablet Take 3.125 mg by mouth 2 (two) times daily with a meal.     gabapentin (NEURONTIN) 100 MG capsule Take 100 mg by mouth at bedtime.    losartan-hydrochlorothiazide (HYZAAR) 100-12.5 MG tablet Take 1 tablet by mouth daily.         DISCHARGE INSTRUCTIONS:    The patient is to follow up with primary care physician and cardiologist as outpatient for outpatient Myoview stress test  If you experience worsening of your admission symptoms, develop  shortness of breath, life threatening emergency, suicidal or homicidal thoughts you must seek medical attention immediately by calling 911 or calling your MD immediately  if symptoms less severe.  You Must read complete instructions/literature along with all the possible adverse reactions/side effects for all the Medicines you take and that have been  prescribed to you. Take any new Medicines after you have completely understood and accept all the possible adverse reactions/side effects.   Please note  You were cared for by a hospitalist during your hospital stay. If you have any questions about your discharge medications or the care you received while you were in the hospital after you are discharged, you can call the unit and asked to speak with the hospitalist on call if the hospitalist that took care of you is not available. Once you are discharged, your primary care physician will handle any further medical issues. Please note that NO REFILLS for any discharge medications will be authorized once you are discharged, as it is imperative that you return to your primary care physician (or establish a relationship with a primary care physician if you do not have one) for your aftercare needs so that they can reassess your need for medications and monitor your lab values.    Today   CHIEF COMPLAINT:   Chief Complaint  Patient presents with  . Chest Pain    HISTORY OF PRESENT ILLNESS:     VITAL SIGNS:  Blood pressure 116/64, pulse 80, temperature 98.1 F (36.7 C), temperature source Oral, resp. rate 20, height 5\' 10"  (1.778 m), weight 95.7 kg (211 lb), SpO2 95 %.  I/O:  No intake or output data in the 24 hours ending 12/19/16 1545  PHYSICAL EXAMINATION:  GENERAL:  61 y.o.-year-old patient lying in the bed with no acute distress.  EYES: Pupils equal, round, reactive to light and accommodation. No scleral icterus. Extraocular muscles intact.  HEENT: Head atraumatic, normocephalic. Oropharynx and nasopharynx clear.  NECK:  Supple, no jugular venous distention. No thyroid enlargement, no tenderness.  LUNGS: Normal breath sounds bilaterally, no wheezing, rales,rhonchi or crepitation. No use of accessory muscles of respiration.  CARDIOVASCULAR: S1, S2 normal. No murmurs, rubs, or gallops.  ABDOMEN: Soft, non-tender, non-distended. Bowel  sounds present. No organomegaly or mass.  EXTREMITIES: No pedal edema, cyanosis, or clubbing.  NEUROLOGIC: Cranial nerves II through XII are intact. Muscle strength 5/5 in all extremities. Sensation intact. Gait not checked.  PSYCHIATRIC: The patient is alert and oriented x 3.  SKIN: No obvious rash, lesion, or ulcer.   DATA REVIEW:   CBC  Recent Labs Lab 12/19/16 0644  WBC 11.1*  HGB 13.5  HCT 38.4*  PLT 299    Chemistries   Recent Labs Lab 12/19/16 0002 12/19/16 0644  NA 136  --   K 4.0  --   CL 100*  --   CO2 25  --   GLUCOSE 126*  --   BUN 21*  --   CREATININE 1.27* 1.46*  CALCIUM 9.9  --     Cardiac Enzymes  Recent Labs Lab 12/19/16 0644  TROPONINI <0.03    Microbiology Results  No results found for this or any previous visit.  RADIOLOGY:  Dg Chest 2 View  Result Date: 12/19/2016 CLINICAL DATA:  Chest pain EXAM: CHEST  2 VIEW COMPARISON:  07/22/2013 FINDINGS: Minimal atelectasis at the left lung base. No pleural effusion. Normal heart size. Aortic atherosclerosis. No pneumothorax. Degenerative changes of the spine. IMPRESSION: Minimal atelectasis at the left  base. Electronically Signed   By: Donavan Foil M.D.   On: 12/19/2016 00:29    EKG:   Orders placed or performed during the hospital encounter of 12/19/16  . EKG 12-Lead  . EKG 12-Lead  . ED EKG within 10 minutes  . ED EKG within 10 minutes      Management plans discussed with the patient, family and they are in agreement.  CODE STATUS:     Code Status Orders        Start     Ordered   12/19/16 0634  Full code  Continuous     12/19/16 0633    Code Status History    Date Active Date Inactive Code Status Order ID Comments User Context   This patient has a current code status but no historical code status.    Advance Directive Documentation     Most Recent Value  Type of Advance Directive  Living will  Pre-existing out of facility DNR order (yellow form or pink MOST form)  -    "MOST" Form in Place?  -      TOTAL TIME TAKING CARE OF THIS PATIENT: 40 minutes.    Theodoro Grist M.D on 12/19/2016 at 3:45 PM  Between 7am to 6pm - Pager - [phone]  After 6pm go to [email or web address] - password [email or web address] [facility]  [place] [facility]  Office  [phone]  CC: Primary care physician; Matthew Ruths, MD

## 2016-12-19 NOTE — ED Notes (Addendum)
Patient waiting patiently in waiting room for treatment room; no complaints at this time; denies pain; given warm blanket for comfort

## 2016-12-20 LAB — ECHOCARDIOGRAM COMPLETE

## 2016-12-22 DIAGNOSIS — I119 Hypertensive heart disease without heart failure: Secondary | ICD-10-CM | POA: Insufficient documentation

## 2016-12-24 LAB — HIV ANTIBODY (ROUTINE TESTING W REFLEX): HIV SCREEN 4TH GENERATION: NONREACTIVE

## 2017-11-02 ENCOUNTER — Other Ambulatory Visit: Payer: Self-pay

## 2017-11-02 ENCOUNTER — Emergency Department: Payer: BC Managed Care – PPO

## 2017-11-02 ENCOUNTER — Encounter: Payer: Self-pay | Admitting: Emergency Medicine

## 2017-11-02 ENCOUNTER — Observation Stay
Admission: EM | Admit: 2017-11-02 | Discharge: 2017-11-03 | Disposition: A | Discharge status: Home / Self Care | Payer: BC Managed Care – PPO | Attending: Internal Medicine | Admitting: Internal Medicine

## 2017-11-02 DIAGNOSIS — R200 Anesthesia of skin: Secondary | ICD-10-CM | POA: Diagnosis present

## 2017-11-02 DIAGNOSIS — Z7982 Long term (current) use of aspirin: Secondary | ICD-10-CM | POA: Diagnosis not present

## 2017-11-02 DIAGNOSIS — N183 Chronic kidney disease, stage 3 (moderate): Secondary | ICD-10-CM | POA: Insufficient documentation

## 2017-11-02 DIAGNOSIS — I129 Hypertensive chronic kidney disease with stage 1 through stage 4 chronic kidney disease, or unspecified chronic kidney disease: Secondary | ICD-10-CM | POA: Diagnosis not present

## 2017-11-02 DIAGNOSIS — I6523 Occlusion and stenosis of bilateral carotid arteries: Secondary | ICD-10-CM | POA: Diagnosis not present

## 2017-11-02 DIAGNOSIS — Z79899 Other long term (current) drug therapy: Secondary | ICD-10-CM | POA: Diagnosis not present

## 2017-11-02 DIAGNOSIS — I6782 Cerebral ischemia: Secondary | ICD-10-CM | POA: Insufficient documentation

## 2017-11-02 DIAGNOSIS — G459 Transient cerebral ischemic attack, unspecified: Principal | ICD-10-CM | POA: Insufficient documentation

## 2017-11-02 DIAGNOSIS — E041 Nontoxic single thyroid nodule: Secondary | ICD-10-CM | POA: Insufficient documentation

## 2017-11-02 DIAGNOSIS — E785 Hyperlipidemia, unspecified: Secondary | ICD-10-CM | POA: Insufficient documentation

## 2017-11-02 DIAGNOSIS — R202 Paresthesia of skin: Secondary | ICD-10-CM | POA: Diagnosis present

## 2017-11-02 DIAGNOSIS — I69351 Hemiplegia and hemiparesis following cerebral infarction affecting right dominant side: Secondary | ICD-10-CM | POA: Diagnosis not present

## 2017-11-02 LAB — CBC WITH DIFFERENTIAL/PLATELET
BASOS ABS: 0 10*3/uL (ref 0–0.1)
Basophils Relative: 0 %
EOS ABS: 0.1 10*3/uL (ref 0–0.7)
EOS PCT: 1 %
HCT: 39.6 % — ABNORMAL LOW (ref 40.0–52.0)
Hemoglobin: 13.6 g/dL (ref 13.0–18.0)
LYMPHS PCT: 18 %
Lymphs Abs: 1.8 10*3/uL (ref 1.0–3.6)
MCH: 31.2 pg (ref 26.0–34.0)
MCHC: 34.5 g/dL (ref 32.0–36.0)
MCV: 90.5 fL (ref 80.0–100.0)
MONO ABS: 0.9 10*3/uL (ref 0.2–1.0)
Monocytes Relative: 9 %
Neutro Abs: 7.1 10*3/uL — ABNORMAL HIGH (ref 1.4–6.5)
Neutrophils Relative %: 72 %
PLATELETS: 318 10*3/uL (ref 150–440)
RBC: 4.37 MIL/uL — AB (ref 4.40–5.90)
RDW: 13.3 % (ref 11.5–14.5)
WBC: 9.8 10*3/uL (ref 3.8–10.6)

## 2017-11-02 LAB — BASIC METABOLIC PANEL
Anion gap: 10 (ref 5–15)
BUN: 26 mg/dL — AB (ref 6–20)
CO2: 26 mmol/L (ref 22–32)
CREATININE: 1.33 mg/dL — AB (ref 0.61–1.24)
Calcium: 9.3 mg/dL (ref 8.9–10.3)
Chloride: 97 mmol/L — ABNORMAL LOW (ref 101–111)
GFR calc Af Amer: >60 mL/min (ref >60)
GFR, EST NON AFRICAN AMERICAN: 55 mL/min — AB (ref >60)
GLUCOSE: 188 mg/dL — AB (ref 65–99)
POTASSIUM: 3.9 mmol/L (ref 3.5–5.1)
SODIUM: 133 mmol/L — AB (ref 135–145)

## 2017-11-02 LAB — GLUCOSE, CAPILLARY: GLUCOSE-CAPILLARY: 205 mg/dL — AB (ref 65–99)

## 2017-11-02 MED ORDER — ASPIRIN 81 MG PO CHEW
324.0000 mg | CHEWABLE_TABLET | Freq: Once | ORAL | Status: AC
  Administered 2017-11-02: 324 mg via ORAL
  Filled 2017-11-02: qty 4

## 2017-11-02 NOTE — Progress Notes (Signed)
   11/02/17 2045  Clinical Encounter Type  Visited With Patient and family together  Visit Type Code  Referral From Nurse  Consult/Referral To Chaplain  Spiritual Encounters  Spiritual Needs Emotional  Stress Factors  Patient Stress Factors Health changes  [place] responded to Code stroke in the ED at 2045 hours, spoke with wife, no other family reportedly present, reassurance given, offered support, will return to check on patient and wife.

## 2017-11-02 NOTE — ED Provider Notes (Signed)
[facility] Emergency Department Provider Note  ____________________________________________  Time seen: Approximately 10:15 PM  I have reviewed the triage vital signs and the nursing notes.   HISTORY  Chief Complaint Weakness    HPI [name] is a 62 y.o. male with a history of hypertension and previous stroke affecting his right side reports that at 7 PM tonight he started having symptoms of feeling off balance and weak in the right leg. Does note that over the past 3 days she's had waxing and waning symptoms of similar nature and dizziness. Denies any trauma fevers or chills.     Past Medical History:  Diagnosis Date  . Hypertension   . Stroke [facility])      Patient Active Problem List   Diagnosis Date Noted  . Chest pain 12/19/2016  . Essential hypertension 12/19/2016  . 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 Leukocytosis 12/19/2016     Past Surgical History:  Procedure Laterality Date  . none       Prior to Admission medications   Medication Sig Start Date End Date Taking? Authorizing Provider  amLODipine (NORVASC) 10 MG tablet Take 10 mg by mouth daily.    [provider]  aspirin EC 81 MG tablet Take 81 mg by mouth daily.    [provider]  atorvastatin (LIPITOR) 20 MG tablet Take 40 mg by mouth daily.    [provider]  carvedilol (COREG) 6.25 MG tablet Take 3.125 mg by mouth 2 (two) times daily with a meal.     [provider]  gabapentin (NEURONTIN) 100 MG capsule Take 100 mg by mouth at bedtime.    [provider]  losartan-hydrochlorothiazide (HYZAAR) 100-12.5 MG tablet Take 1 tablet by mouth daily.    [provider]  nitroGLYCERIN (NITROSTAT) 0.4 MG SL tablet Place 1 tablet (0.4 mg total) under the tongue every 5 (five) minutes x 3 doses as needed for chest pain. 12/19/16   Theodoro Grist, MD  pantoprazole (PROTONIX) 40 MG tablet Take 1 tablet (40 mg total) by  mouth daily. 12/19/16 12/19/17  Theodoro Grist, MD  sucralfate (CARAFATE) 1 g tablet Take 1 tablet (1 g total) by mouth 3 (three) times daily as needed. 12/19/16 12/19/17  Theodoro Grist, MD     Allergies Patient has no known allergies.   Family History  Problem Relation Age of Onset  . Hypertension Mother   . Hypertension Father     Social History Social History   Tobacco Use  . Smoking status: Never Smoker  . Smokeless tobacco: Never Used  Substance Use Topics  . Alcohol use: No  . Drug use: No    Review of Systems  Constitutional:   No fever or chills.  ENT:   No sore throat. No rhinorrhea. Cardiovascular:   No chest pain or syncope. Respiratory:   No dyspnea or cough. Gastrointestinal:   Negative for abdominal pain, vomiting and diarrhea.  Musculoskeletal:   Negative for focal pain or swelling All other systems reviewed and are negative except as documented above in ROS and HPI.  ____________________________________________   PHYSICAL EXAM:  VITAL SIGNS: ED Triage Vitals  Enc Vitals Group     BP 11/02/17 2033 (!) 158/82     Pulse Rate 11/02/17 2033 82     Resp 11/02/17 2033 18     Temp 11/02/17 2033 98 F (36.7 C)     Temp Source 11/02/17 2033 Oral     SpO2 11/02/17 2033  100 %     Weight 11/02/17 2032 214 lb (97.1 kg)     Height 11/02/17 2032 5\' 10"  (1.778 m)     Head Circumference --      Peak Flow --      Pain Score 11/02/17 2032 0     Pain Loc --      Pain Edu? --      Excl. in [place]? --     Vital signs reviewed, nursing assessments reviewed.   Constitutional:   Alert and oriented. Well appearing and in no distress. Eyes:   Conjunctivae are normal. EOMI. PERRL. ENT      Head:   Normocephalic and atraumatic.      Nose:   No congestion/rhinnorhea.       Mouth/Throat:   MMM, no pharyngeal erythema. No peritonsillar mass.       Neck:   No meningismus. Full ROM. Hematological/Lymphatic/Immunilogical:   No cervical lymphadenopathy. Cardiovascular:    RRR. Symmetric bilateral radial and DP pulses.  No murmurs.  Respiratory:   Normal respiratory effort without tachypnea/retractions. Breath sounds are clear and equal bilaterally. No wheezes/rales/rhonchi. Gastrointestinal:   Soft and nontender. Non distended. There is no CVA tenderness.  No rebound, rigidity, or guarding. Genitourinary:   deferred Musculoskeletal:   Normal range of motion in all extremities. No joint effusions.  No lower extremity tenderness.  No edema. Neurologic:   Normal speech and language. cranial nerves III through XII intact.  No pronator drift Normal finger to nose Normal gait Motor intact  Motor grossly intact. No acute focal neurologic deficits are appreciated.  Skin:    Skin is warm, dry and intact. No rash noted.  No petechiae, purpura, or bullae.  ____________________________________________    LABS (pertinent positives/negatives) (all labs ordered are listed, but only abnormal results are displayed) Labs Reviewed  GLUCOSE, CAPILLARY - Abnormal; Notable for the following components:      Result Value   Glucose-Capillary 205 (*)    All other components within normal limits  BASIC METABOLIC PANEL - Abnormal; Notable for the following components:   Sodium 133 (*)    Chloride 97 (*)    Glucose, Bld 188 (*)    BUN 26 (*)    Creatinine, Ser 1.33 (*)    GFR calc non Af Amer 55 (*)    All other components within normal limits  CBC WITH DIFFERENTIAL/PLATELET - Abnormal; Notable for the following components:   RBC 4.37 (*)    HCT 39.6 (*)    Neutro Abs 7.1 (*)    All other components within normal limits   ____________________________________________   EKG  interpreted by me  Date: 11/02/2017  Rate: 84  Rhythm: normal sinus rhythm  QRS Axis: normal  Intervals: normal  ST/T Wave abnormalities: normal  Conduction Disutrbances: none  Narrative Interpretation: unremarkable      ____________________________________________     RADIOLOGY  Ct Head Code Stroke Wo Contrast`  Result Date: 11/02/2017 CLINICAL DATA:  Code stroke. Right leg weakness beginning at 7 p.m., gait imbalance. History of stroke and hypertension. EXAM: CT HEAD WITHOUT CONTRAST TECHNIQUE: Contiguous axial images were obtained from the base of the skull through the vertex without intravenous contrast. COMPARISON:  CT head March 01, 2016 FINDINGS: BRAIN: No intraparenchymal hemorrhage, mass effect nor midline shift. Mild-to-moderate parenchymal brain volume loss. Patchy supratentorial white matter hypodensities. Old left basal ganglia versus internal capsule lacunar infarct. No acute large vascular territory infarcts. No abnormal extra-axial fluid collections. Basal cisterns are  patent. VASCULAR: Mild calcific atherosclerosis of the carotid siphons and vertebral arteries. SKULL: No skull fracture. No significant scalp soft tissue swelling. SINUSES/ORBITS: Trace paranasal sinus mucosal thickening. Mastoid air cells are well aerated.The included ocular globes and orbital contents are non-suspicious. OTHER: None. ASPECTS [facility] Stroke Program Early CT Score) - Ganglionic level infarction (caudate, lentiform nuclei, internal capsule, insula, M1-M3 cortex): 7 - Supraganglionic infarction (M4-M6 cortex): 3 Total score (0-10 with 10 being normal): 10 IMPRESSION: 1. No acute intracranial process. 2. ASPECTS is 10. 3. Mild-to-moderate parenchymal brain volume loss, advanced for age. Mild chronic small vessel ischemic changes. Old lacunar infarct. 4. Critical Value/emergent results were called by telephone at the time of interpretation on 11/02/2017 at 8:53 pm to Dr. Carrie Mew , who verbally acknowledged these results. Electronically Signed   By: Elon Alas M.D.   On: 11/02/2017 20:55    ____________________________________________   PROCEDURES Procedures  ____________________________________________  DIFFERENTIAL DIAGNOSIS   ischemic stroke,  intracranial hemorrhage, recurrence of prior stroke symptoms, dehydration, anxiety  CLINICAL IMPRESSION / ASSESSMENT AND PLAN / ED COURSE  Pertinent labs & imaging results that were available during my care of the patient were reviewed by me and considered in my medical decision making (see chart for details).    patient presents with feeling "off" for the past 3 days, waxing and waning symptoms persisting for greater than 24 hours at a time. Symptoms affected on the right side in the same distribution as his prior stroke that occurred 4 years ago. He feels off balance and has tingling. Denies headache. Code stroke initiated due to his subjective symptoms despite lack of objective findings on exam. LV O-  Clinical Course as of Nov 03 2230  Wed Nov 02, 2017  2137 CT had previously discussed with radiology upon their immediate review of the imaging, no acute intracranial hemorrhage or stroke identifiable on CT. Plenty of evidence of underlying disease.  I discuss discussed the case with the neurologist after his evaluation. He notes that the NIH stroke scale of 0, but because the patient has had waxing and waning symptoms for the past 3 days and has severe underlying disease and previous stroke, he recommends hospitalization for further stroke workup.  He doesn't feel that it would be safe to obtain an MRI in the ED and discharge home if negative.  I will give aspirin 324 po chew.    [PS]    Clinical Course User Index [PS] Carrie Mew, MD     ____________________________________________   FINAL CLINICAL IMPRESSION(S) / ED DIAGNOSES    Final diagnoses:  Paresthesia     ED Discharge Orders    None      Portions of this note were generated with dragon dictation software. Dictation errors may occur despite best attempts at proofreading.    Carrie Mew, MD 11/02/17 2232

## 2017-11-02 NOTE — ED Triage Notes (Addendum)
Pt to triage via w/c with no distress noted; pt reports hx CVA in 2015 (affected right side); st right leg weakness since 7pm and feels off balance; denies any pain; pt A&Ox3, equal/strong hand grips; speech clear, smile symmetrical; charge nurse notified and pt taken to room 2 by EDT Laury Axon for further eval; report called to care nurse Keane Police, RN

## 2017-11-02 NOTE — Consult Note (Signed)
   [facility] Tele[facility]urology Consult Services  Impression: Unclear of etiology. Suspect probably a stroke, left hemispheric- given he had numbness son the right arm for at least 24hrs    Not a tpa candidate due to: nih 0, symptoms ongoing for the past few days Does not meet LVO screening criteria (no aphasia, neglect, gaze deviation, dense hemiparesis, visual field deficits on exam); therefore, advanced imaging not recommended.   Differential Diagnosis:   1. Cardioembolic stroke  2. Small vessel disease/lacune  3. Thromboembolic, artery-to-artery mechanism  4. Hypercoagulable state-related infarct  5. Transient ischemic attack  6. Thrombotic mechanism, large artery disease   Comments:   [facility] contacted: [phone] [facility] at bedside: [phone]  Recommendations:  Activate Stroke protocol admission/order set  Stroke/telemetry floor Neuro checks Bedside swallow eval Dvt prophylaxis IV fluids, normal saline ASA if no contraindications Head of bed below 30 degrees Euglycemia and avoid hyperthermia (prn acetaminophen)  inpatient neurology consultation Inpatient stroke evaluation as per Neurology/ Internal Medicine Discussed with ED MD  -----------------------------------------------------------------------------------------  CC "feeling weird"  History of Present Illness   Patient is a  62 year old man who has been feeling off for the past few days, since Sunday. He describes that from Monday to Tuesday he ahd right arm tingling, but that has improved. He main describes that he does not feel right and that he is off- no weakness, no vision loss, no difficulties speaking.  Diagnostic: Ct head without contrast:  1. No acute intracranial process. 2. ASPECTS is 10. 3. Mild-to-moderate parenchymal brain volume loss, advanced for age. Mild chronic small vessel ischemic changes. Old lacunar infarct.  Exam:  NIHSS score: 0  1A: Level of Consciousness - Alert; keenly  responsive 0 1B: Ask Month and Age - Both Questions Right 0 1C: 'Blink Eyes' & 'Squeeze Hands' - Performs Both Tasks 0 2: Test Horizontal Extraocular Movements - Normal 0 3: Test Visual Fields - No Visual Loss 0 4: Test Facial Palsy - Normal symmetry 0 5A: Test Left Arm Motor Drift - No Drift for 10 Seconds 0 5B: Test Right Arm Motor Drift - No Drift for 10 Seconds 0 6A: Test Left Leg Motor Drift - No Drift for 5 Seconds 0 6B: Test Right Leg Motor Drift - No Drift for 5 Seconds 0 7: Test Limb Ataxia - No Ataxia 0 8: Test Sensation - Normal; No sensory loss 0 9: Test Language/Aphasia - Normal; No aphasia 0 10: Test Dysarthria - Normal 0 11: Test Extinction/Inattention - No abnormality 0       Medical Decision Making:  - Extensive number of diagnosis or management options are considered above.   - Extensive amount of complex data reviewed.   - High risk of complication and/or morbidity or mortality are associated with differential diagnostic considerations above.  - There may be Uncertain outcome and increased probability of prolonged functional impairment or high probability of severe prolonged functional impairment associated with some of these differential diagnosis.  Medical Data Reviewed:  1.Data reviewed include clinical labs, radiology,  Medical Tests;   2.Tests results discussed w/performing or interpreting physician;   3.Obtaining/reviewing old medical records;  4.Obtaining case history from another source;  5.Independent review of image, tracing or specimen.    Patient was informed the Neurology Consult would happen via telehealth (remote video) and consented to receiving care in this manner.

## 2017-11-03 ENCOUNTER — Observation Stay
Admit: 2017-11-03 | Discharge: 2017-11-03 | Disposition: A | Discharge status: Home / Self Care | Payer: BC Managed Care – PPO | Attending: Internal Medicine | Admitting: Internal Medicine

## 2017-11-03 ENCOUNTER — Observation Stay: Payer: BC Managed Care – PPO

## 2017-11-03 ENCOUNTER — Other Ambulatory Visit: Payer: Self-pay

## 2017-11-03 DIAGNOSIS — R202 Paresthesia of skin: Secondary | ICD-10-CM | POA: Diagnosis present

## 2017-11-03 DIAGNOSIS — R200 Anesthesia of skin: Secondary | ICD-10-CM | POA: Diagnosis present

## 2017-11-03 LAB — CBC
HEMATOCRIT: 39.3 % — AB (ref 40.0–52.0)
HEMOGLOBIN: 13.5 g/dL (ref 13.0–18.0)
MCH: 31.1 pg (ref 26.0–34.0)
MCHC: 34.4 g/dL (ref 32.0–36.0)
MCV: 90.3 fL (ref 80.0–100.0)
Platelets: 303 10*3/uL (ref 150–440)
RBC: 4.35 MIL/uL — ABNORMAL LOW (ref 4.40–5.90)
RDW: 13.5 % (ref 11.5–14.5)
WBC: 9.5 10*3/uL (ref 3.8–10.6)

## 2017-11-03 LAB — LIPID PANEL
CHOLESTEROL: 122 mg/dL (ref 0–200)
HDL: 34 mg/dL — ABNORMAL LOW (ref >40)
LDL Cholesterol: 64 mg/dL (ref 0–99)
TRIGLYCERIDES: 121 mg/dL (ref <150)
Total CHOL/HDL Ratio: 3.6 RATIO
VLDL: 24 mg/dL (ref 0–40)

## 2017-11-03 LAB — CREATININE, SERUM: CREATININE: 1.14 mg/dL (ref 0.61–1.24)

## 2017-11-03 LAB — HEMOGLOBIN A1C
HEMOGLOBIN A1C: 6.4 % — AB (ref 4.8–5.6)
Mean Plasma Glucose: 136.98 mg/dL

## 2017-11-03 LAB — ECHOCARDIOGRAM COMPLETE
HEIGHTINCHES: 70 in
Weight: 3500.8 oz

## 2017-11-03 MED ORDER — ACETAMINOPHEN 650 MG RE SUPP: 650.0000 mg | RECTAL | Status: DC | PRN

## 2017-11-03 MED ORDER — LOSARTAN POTASSIUM 50 MG PO TABS
100.0000 mg | ORAL_TABLET | Freq: Every day | ORAL | Status: DC
  Administered 2017-11-03: 11:00:00 100 mg via ORAL
  Filled 2017-11-03: qty 2

## 2017-11-03 MED ORDER — ATORVASTATIN CALCIUM 20 MG PO TABS: 40.0000 mg | ORAL_TABLET | Freq: Every day | ORAL | Status: DC

## 2017-11-03 MED ORDER — AMLODIPINE BESYLATE 10 MG PO TABS
10.0000 mg | ORAL_TABLET | Freq: Every day | ORAL | Status: DC
  Administered 2017-11-03: 11:00:00 10 mg via ORAL
  Filled 2017-11-03: qty 1

## 2017-11-03 MED ORDER — ASPIRIN EC 325 MG PO TBEC
325.0000 mg | DELAYED_RELEASE_TABLET | Freq: Every day | ORAL | Status: DC
  Administered 2017-11-03: 325 mg via ORAL
  Filled 2017-11-03: qty 1

## 2017-11-03 MED ORDER — HEPARIN SODIUM (PORCINE) 5000 UNIT/ML IJ SOLN
5000.0000 [IU] | Freq: Three times a day (TID) | INTRAMUSCULAR | Status: DC
  Administered 2017-11-03: 5000 [IU] via SUBCUTANEOUS
  Filled 2017-11-03: qty 1

## 2017-11-03 MED ORDER — NITROGLYCERIN 0.4 MG SL SUBL: 0.4000 mg | SUBLINGUAL_TABLET | SUBLINGUAL | Status: DC | PRN

## 2017-11-03 MED ORDER — ACETAMINOPHEN 325 MG PO TABS: 650.0000 mg | ORAL_TABLET | ORAL | Status: DC | PRN

## 2017-11-03 MED ORDER — LOSARTAN POTASSIUM-HCTZ 100-12.5 MG PO TABS: 1.0000 | ORAL_TABLET | Freq: Every day | ORAL | Status: DC

## 2017-11-03 MED ORDER — HYDROCHLOROTHIAZIDE 12.5 MG PO CAPS
12.5000 mg | ORAL_CAPSULE | Freq: Every day | ORAL | Status: DC
  Administered 2017-11-03: 12.5 mg via ORAL
  Filled 2017-11-03: qty 1

## 2017-11-03 MED ORDER — STROKE: EARLY STAGES OF RECOVERY BOOK
Freq: Once | Status: AC
  Administered 2017-11-03: 03:00:00

## 2017-11-03 MED ORDER — SENNOSIDES-DOCUSATE SODIUM 8.6-50 MG PO TABS: 1.0000 | ORAL_TABLET | Freq: Every evening | ORAL | Status: DC | PRN

## 2017-11-03 MED ORDER — SODIUM CHLORIDE 0.9 % IV SOLN
Freq: Once | INTRAVENOUS | Status: AC
  Administered 2017-11-03: 03:00:00 via INTRAVENOUS

## 2017-11-03 MED ORDER — ACETAMINOPHEN 160 MG/5ML PO SOLN
650.0000 mg | ORAL | Status: DC | PRN
  Filled 2017-11-03: qty 20.3

## 2017-11-03 MED ORDER — ASPIRIN EC 81 MG PO TBEC: 81.0000 mg | DELAYED_RELEASE_TABLET | Freq: Every day | ORAL | Status: DC

## 2017-11-03 MED ORDER — ASPIRIN 325 MG PO TABS: 325.0000 mg | ORAL_TABLET | Freq: Every day | ORAL | Status: DC

## 2017-11-03 MED ORDER — CARVEDILOL 3.125 MG PO TABS
3.1250 mg | ORAL_TABLET | Freq: Two times a day (BID) | ORAL | Status: DC
  Administered 2017-11-03: 11:00:00 3.125 mg via ORAL
  Filled 2017-11-03: qty 1

## 2017-11-03 MED ORDER — GABAPENTIN 100 MG PO CAPS: 100.0000 mg | ORAL_CAPSULE | Freq: Every day | ORAL | Status: DC

## 2017-11-03 MED ORDER — ASPIRIN 325 MG PO TBEC: 325.0000 mg | DELAYED_RELEASE_TABLET | Freq: Every day | ORAL | 0 refills | Status: DC

## 2017-11-03 NOTE — Care Management Note (Signed)
Case Management Note  Patient Details  Name: [name] MRN: [id number] Date of Birth: 1955/06/15  Subjective/Objective:   Admitted to [facility] under observation with the diagnosis of numbness and tingling. Lives with wife, [name] [phone]). Last seen Dr. Ouida Sills 06/17/17. Prescriptions are filled at [facility] on [address]. Home Health per [place] in 2015 following a stroke. No skilled facility. No home oxygen. Cane available, if needed. Takes care of all basic activities of daily living himself, drives. No falls. Good appetite. Wife will transport                 Action/Plan: No needs identified at this time.   Expected Discharge Date:                  Expected Discharge Plan:     In-House Referral:   yes  Discharge planning Services   yes  Post Acute Care Choice:    Choice offered to:     DME Arranged:    DME Agency:     HH Arranged:    HH Agency:     Status of Service:     If discussed at [employer] of Stay Meetings, dates discussed:    Additional Comments:  Shelbie Ammons, RN MSN CCM Care Management [phone] 11/03/2017, 9:30 AM

## 2017-11-03 NOTE — Progress Notes (Signed)
OT Cancellation Note  Patient Details Name: [name] MRN: [id number] DOB: 1956-05-19   Cancelled Treatment:    Reason Eval/Treat Not Completed: Patient at procedure or test/ unavailable(Pt. was preparing to have a nursing asseeeement, and then have breakfast. Will continue to reattempt an initial OT visit at a later time/date.)  Harrel Carina, MS, OTR/L 11/03/2017, 12:24 PM

## 2017-11-03 NOTE — H&P (Signed)
[facility] [place] NAME: [name]    MR#:  [id number]  DATE OF BIRTH:  1956/01/23  DATE OF ADMISSION:  11/02/2017  PRIMARY CARE PHYSICIAN: Kirk Ruths, MD   REQUESTING/REFERRING PHYSICIAN:   CHIEF COMPLAINT:   Chief Complaint  Patient presents with  . Weakness    HISTORY OF PRESENT ILLNESS: [name]  is a 62 y.o. male with a known history of hypertension and remote stroke 4 years ago with right-sided weakness that has resolved.  Patient presented to emergency room this time for episodes of tingling to the right arm, going on for the past 2 to 3 days.  Patient also complained of unstable gait in the past 24 hours.  No weakness, no difficulty speaking.  No fever, chills, no chest pain no nausea, vomiting, diarrhea.  He has been compliant with aspirin, 81 mg daily. Patient is currently asymptomatic, back to baseline. Blood test done emergency room, including CBC and CMP are fairly unremarkable except for creatinine level of 1.33 and glucose level at 188. CT of the brain, reviewed by myself, is noted without any acute changes. Patient was evaluated by tele-neurology who recommended 24-hour observation, with frequent neuro checks and further imaging, if needed.   PAST MEDICAL HISTORY:   Past Medical History:  Diagnosis Date  . Hypertension   . Stroke [facility])     PAST SURGICAL HISTORY:  Past Surgical History:  Procedure Laterality Date  . none      SOCIAL HISTORY:  Social History   Tobacco Use  . Smoking status: Never Smoker  . Smokeless tobacco: Never Used  Substance Use Topics  . Alcohol use: No    FAMILY HISTORY:  Family History  Problem Relation Age of Onset  . Hypertension Mother   . Hypertension Father     DRUG ALLERGIES: No Known Allergies  REVIEW OF SYSTEMS:   CONSTITUTIONAL: No fever, fatigue or weakness.  EYES: No blurred or double vision.  EARS, NOSE, AND THROAT: No tinnitus or ear  pain.  RESPIRATORY: No cough, shortness of breath, wheezing or hemoptysis.  CARDIOVASCULAR: No chest pain, orthopnea, edema.  GASTROINTESTINAL: No nausea, vomiting, diarrhea or abdominal pain.  GENITOURINARY: No dysuria, hematuria.  ENDOCRINE: No polyuria, nocturia,  HEMATOLOGY: No bleeding SKIN: No rash or lesion. MUSCULOSKELETAL: No joint pain or arthritis.   NEUROLOGIC: Positive for right upper extremity tingling.  Positive for unstable gait. PSYCHIATRY: No anxiety or depression.   MEDICATIONS AT HOME:  Prior to Admission medications   Medication Sig Start Date End Date Taking? Authorizing Provider  amLODipine (NORVASC) 10 MG tablet Take 10 mg by mouth daily.   Yes [provider]  aspirin EC 81 MG tablet Take 81 mg by mouth daily.   Yes [provider]  atorvastatin (LIPITOR) 20 MG tablet Take 40 mg by mouth daily.   Yes [provider]  carvedilol (COREG) 6.25 MG tablet Take 3.125 mg by mouth 2 (two) times daily with a meal.    Yes [provider]  gabapentin (NEURONTIN) 100 MG capsule Take 100 mg by mouth at bedtime.   Yes [provider]  losartan-hydrochlorothiazide (HYZAAR) 100-12.5 MG tablet Take 1 tablet by mouth daily.   Yes [provider]  nitroGLYCERIN (NITROSTAT) 0.4 MG SL tablet Place 1 tablet (0.4 mg total) under the tongue every 5 (five) minutes x 3 doses as needed for chest pain. 12/19/16  Yes Theodoro Grist, MD  pantoprazole (PROTONIX) 40 MG  tablet Take 1 tablet (40 mg total) by mouth daily. Patient not taking: Reported on 11/02/2017 12/19/16 12/19/17  Theodoro Grist, MD  sucralfate (CARAFATE) 1 g tablet Take 1 tablet (1 g total) by mouth 3 (three) times daily as needed. Patient not taking: Reported on 11/02/2017 12/19/16 12/19/17  Theodoro Grist, MD      PHYSICAL EXAMINATION:   VITAL SIGNS: Blood pressure (!) 151/77, pulse 66, temperature 98.3 F (36.8 C), temperature source Oral, resp. rate 20, height 5\' 10"  (1.778  m), weight 99.2 kg (218 lb 12.8 oz), SpO2 99 %.  GENERAL:  62 y.o.-year-old patient lying in the bed with no acute distress.  EYES: Pupils equal, round, reactive to light and accommodation. No scleral icterus. Extraocular muscles intact.  HEENT: Head atraumatic, normocephalic. Oropharynx and nasopharynx clear.  NECK:  Supple, no jugular venous distention. No thyroid enlargement, no tenderness.  LUNGS: Normal breath sounds bilaterally, no wheezing, rales,rhonchi or crepitation. No use of accessory muscles of respiration.  CARDIOVASCULAR: S1, S2 normal. No S3/S4.  ABDOMEN: Soft, nontender, nondistended. Bowel sounds present. No organomegaly or mass.  EXTREMITIES: No pedal edema, cyanosis, or clubbing.  NEUROLOGIC: Cranial nerves II through XII are intact. Muscle strength 5/5 in all extremities. Sensation intact. Gait is stable.   PSYCHIATRIC: The patient is alert and oriented x 3.  SKIN: No obvious rash, lesion, or ulcer.   LABORATORY PANEL:   CBC Recent Labs  Lab 11/02/17 2101  WBC 9.8  HGB 13.6  HCT 39.6*  PLT 318  MCV 90.5  MCH 31.2  MCHC 34.5  RDW 13.3  LYMPHSABS 1.8  MONOABS 0.9  EOSABS 0.1  BASOSABS 0.0   ------------------------------------------------------------------------------------------------------------------  Chemistries  Recent Labs  Lab 11/02/17 2101  NA 133*  K 3.9  CL 97*  CO2 26  GLUCOSE 188*  BUN 26*  CREATININE 1.33*  CALCIUM 9.3   ------------------------------------------------------------------------------------------------------------------ estimated creatinine clearance is 67.1 mL/min (A) (by C-G formula based on SCr of 1.33 mg/dL (H)). ------------------------------------------------------------------------------------------------------------------ No results for input(s): TSH, T4TOTAL, T3FREE, THYROIDAB in the last 72 hours.  Invalid input(s): FREET3   Coagulation profile No results for input(s): INR, PROTIME in the last 168  hours. ------------------------------------------------------------------------------------------------------------------- No results for input(s): DDIMER in the last 72 hours. -------------------------------------------------------------------------------------------------------------------  Cardiac Enzymes No results for input(s): CKMB, TROPONINI, MYOGLOBIN in the last 168 hours.  Invalid input(s): CK ------------------------------------------------------------------------------------------------------------------ Invalid input(s): POCBNP  ---------------------------------------------------------------------------------------------------------------  Urinalysis No results found for: COLORURINE, APPEARANCEUR, LABSPEC, PHURINE, GLUCOSEU, HGBUR, BILIRUBINUR, KETONESUR, PROTEINUR, UROBILINOGEN, NITRITE, LEUKOCYTESUR   RADIOLOGY: Ct Head Code Stroke Wo Contrast`  Result Date: 11/02/2017 CLINICAL DATA:  Code stroke. Right leg weakness beginning at 7 p.m., gait imbalance. History of stroke and hypertension. EXAM: CT HEAD WITHOUT CONTRAST TECHNIQUE: Contiguous axial images were obtained from the base of the skull through the vertex without intravenous contrast. COMPARISON:  CT head March 01, 2016 FINDINGS: BRAIN: No intraparenchymal hemorrhage, mass effect nor midline shift. Mild-to-moderate parenchymal brain volume loss. Patchy supratentorial white matter hypodensities. Old left basal ganglia versus internal capsule lacunar infarct. No acute large vascular territory infarcts. No abnormal extra-axial fluid collections. Basal cisterns are patent. VASCULAR: Mild calcific atherosclerosis of the carotid siphons and vertebral arteries. SKULL: No skull fracture. No significant scalp soft tissue swelling. SINUSES/ORBITS: Trace paranasal sinus mucosal thickening. Mastoid air cells are well aerated.The included ocular globes and orbital contents are non-suspicious. OTHER: None. ASPECTS [facility] Stroke Program  Early CT Score) - Ganglionic level infarction (caudate, lentiform nuclei, internal capsule, insula, M1-M3 cortex): 7 - Supraganglionic  infarction (M4-M6 cortex): 3 Total score (0-10 with 10 being normal): 10 IMPRESSION: 1. No acute intracranial process. 2. ASPECTS is 10. 3. Mild-to-moderate parenchymal brain volume loss, advanced for age. Mild chronic small vessel ischemic changes. Old lacunar infarct. 4. Critical Value/emergent results were called by telephone at the time of interpretation on 11/02/2017 at 8:53 pm to Dr. Carrie Mew , who verbally acknowledged these results. Electronically Signed   By: Elon Alas M.D.   On: 11/02/2017 20:55    EKG: Orders placed or performed during the hospital encounter of 11/02/17  . ED EKG  . ED EKG    IMPRESSION AND PLAN:  1.  Right upper extremity tingling and unstable gait, resolved.  Will rule out TIA.  Continue close observation with frequent neuro checks.  We will check MRI of the brain, carotid Doppler and 2D echo.  Continue statin therapy and aspirin 325 mg a day.  Neurology is consulted for further evaluation and treatment.  2.  Hypertension, stable continue home medications. 3.  Hyperlipidemia, on statin. 4.  CKD3, creatinine level is at baseline.  Continue to monitor kidney function closely and avoid nephrotoxic medications.  All the records are reviewed and case discussed with ED provider. Management plans discussed with the patient, family and they are in agreement.  CODE STATUS: FULL    Code Status Orders  (From admission, onward)        Start     Ordered   11/03/17 0241  Full code  Continuous     11/03/17 0240    Code Status History    Date Active Date Inactive Code Status Order ID Comments User Context   12/19/2016 0633 12/19/2016 2114 Full Code [id number]  Saundra Shelling, MD Inpatient    Advance Directive Documentation     Most Recent Value  Type of Advance Directive  Healthcare Power of [place], Living will   Pre-existing out of facility DNR order (yellow form or pink MOST form)  -  "MOST" Form in Place?  -       TOTAL TIME TAKING CARE OF THIS PATIENT: 45 minutes.    Amelia Jo M.D on 11/03/2017 at 5:36 AM  Between 7am to 6pm - Pager - [phone]  After 6pm go to [email or web address] - password [email or web address] [facility]  [facility]  Office  [phone]  CC: Primary care physician; Kirk Ruths, MD

## 2017-11-03 NOTE — Progress Notes (Addendum)
OT Cancellation Note  Patient Details Name: [name] MRN: [id number] DOB: May 04, 1956   Cancelled Treatment:    Reason Eval/Treat Not Completed: Patient at procedure or test/ unavailable. Will attempt an OT initial visit at a later time/date.  Harrel Carina, MS, OTR/L 11/03/2017, 9:35 AM

## 2017-11-03 NOTE — Progress Notes (Signed)
OT Screen Note  Patient Details Name: [name] MRN: [id number] DOB: 23-Jan-1956   Cancelled Treatment:    Reason Eval/Treat Not Completed: OT screened, no needs identified, will sign off. Order received, chart reviewed. Upon entry into room, pt sitting EOB eating lunch, spouse present. Pt denies any symptoms, no functional deficits. No sensory/strength/coordination deficits appreciated. Pt independent with ADL. No skilled OT needs identified. Will sign off. Please re-consult if additional skilled OT needs arise this hospitalization.  Jeni Salles, MPH, MS, OTR/L ascom [phone] 11/03/17, 1:37 PM

## 2017-11-03 NOTE — Evaluation (Signed)
Physical Therapy Evaluation Patient Details Name: [name] MRN: [id number] DOB: [birth year] Today's Date: 11/03/2017   History of Present Illness  62 y.o. male with a known history of hypertension and remote stroke 4 years ago with right-sided weakness that has resolved. Pt how here with transient R sided weakness and unsteadiness.  Clinical Impression  Pt did well with ambulation, balance, mobility and reports feeling back to his baseline (wife confirms this).  He had very good as well as equal UE strength bilaterally.  He had no safety issues and was confident with all aspects of mobility, gait, etc. Pt independent with all tasks, no further PT needs.    Follow Up Recommendations No PT follow up    Equipment Recommendations  None recommended by PT    Recommendations for Other Services       Precautions / Restrictions Precautions Precautions: None Restrictions Weight Bearing Restrictions: No      Mobility  Bed Mobility Overal bed mobility: Independent                Transfers Overall transfer level: Independent Equipment used: None             General transfer comment: Pt able to rise and maintain balance w/o issue  Ambulation/Gait Ambulation/Gait assistance: Independent Ambulation Distance (Feet): 250 Feet Assistive device: None       General Gait Details: Pt was able to ambulate with consistent speed and cadence.  He had no safety issues, no significant fatigue and reports feeling back to baseline.   Stairs Stairs: Yes Stairs assistance: Independent Stair Management: One rail Left Number of Stairs: 6 General stair comments: Pt negotiates up/down steps reciprocally without issue  Wheelchair Mobility    Modified Rankin (Stroke Patients Only)       Balance Overall balance assessment: Independent                                           Pertinent Vitals/Pain Pain Assessment: No/denies pain    Home Living  Family/patient expects to be discharged to:: Private residence Living Arrangements: Spouse/significant other     Home Access: Stairs to enter Entrance Stairs-Rails: Left Entrance Stairs-Number of Steps: 4          Prior Function Level of Independence: Independent         Comments: Pt out of the house almost daily, push mows the yard, drives, etc     Hand Dominance        Extremity/Trunk Assessment   Upper Extremity Assessment Upper Extremity Assessment: Overall WFL for tasks assessed(= b/l)    Lower Extremity Assessment Lower Extremity Assessment: Overall WFL for tasks assessed       Communication   Communication: No difficulties  Cognition Arousal/Alertness: Awake/alert Behavior During Therapy: WFL for tasks assessed/performed Overall Cognitive Status: Within Functional Limits for tasks assessed                                        General Comments      Exercises     Assessment/Plan    PT Assessment Patent does not need any further PT services  PT Problem List         PT Treatment Interventions      PT Goals (Current goals can be found in the  Care Plan section)  Acute Rehab PT Goals Patient Stated Goal: go home PT Goal Formulation: All assessment and education complete, DC therapy    Frequency     Barriers to discharge        Co-evaluation               AM-PAC PT "6 Clicks" Daily Activity  Outcome Measure Difficulty turning over in bed (including adjusting bedclothes, sheets and blankets)?: None Difficulty moving from lying on back to sitting on the side of the bed? : None Difficulty sitting down on and standing up from a chair with arms (e.g., wheelchair, bedside commode, etc,.)?: None Help needed moving to and from a bed to chair (including a wheelchair)?: None Help needed walking in hospital room?: None Help needed climbing 3-5 steps with a railing? : None 6 Click Score: 24    End of Session Equipment  Utilized During Treatment: Gait belt Activity Tolerance: Patient tolerated treatment well Patient left: in chair;with call bell/phone within reach;with family/visitor present   PT Visit Diagnosis: Unsteadiness on feet (R26.81)    Time: 1127-1140 PT Time Calculation (min) (ACUTE ONLY): 13 min   Charges:   PT Evaluation $PT Eval Low Complexity: 1 Low     PT G Codes:        Kreg Shropshire, DPT 11/03/2017, 1:26 PM

## 2017-11-03 NOTE — Progress Notes (Signed)
*  PRELIMINARY RESULTS* Echocardiogram 2D Echocardiogram has been performed.  Sherrie Sport 11/03/2017, 9:19 AM

## 2017-11-03 NOTE — Progress Notes (Signed)
SLP Cancellation Note  Patient Details Name: [name] MRN: [id number] DOB: 07/16/1955   Cancelled treatment:       Reason Eval/Treat Not Completed: SLP screened, no needs identified, will sign off(chart reviewed; consulted NSG then met w/ pt/wife). Pt denied any difficulty swallowing and is currently on a regular diet; tolerates swallowing pills w/ water per NSG. Pt conversed at conversational level w/out deficits noted; pt and wife denied any speech-language deficits.  No further skilled ST services indicated as pt appears at his baseline. Pt agreed. NSG to reconsult if any change in status.     Katherine Watson, MS, CCC-SLP Watson,Katherine 11/03/2017, 12:50 PM   

## 2017-11-03 NOTE — Progress Notes (Signed)
Inpatient Diabetes Program Recommendations  AACE/ADA: New Consensus Statement on Inpatient Glycemic Control (2015)  Target Ranges:  Prepandial:   less than 140 mg/dL      Peak postprandial:   less than 180 mg/dL (1-2 hours)      Critically ill patients:  140 - 180 mg/dL   Results for DORELL, GATLIN (MRN [id number]) as of 11/03/2017 09:46  Ref. Range 11/02/2017 20:53  Glucose-Capillary Latest Ref Range: 65 - 99 mg/dL 205 (H)   Results for KOLT, MCWHIRTER (MRN [id number]) as of 11/03/2017 09:46  Ref. Range 11/03/2017 05:25  Hemoglobin A1C Latest Ref Range: 4.8 - 5.6 % 6.4 (H)     Admit with: Weakness  History: DM, CVA, HTN  Home DM Meds: None- Diet controlled  Current Insulin Orders: None    Last saw PCP Dr. Frazier Richards with [facility] on 06/17/2017.  Diabetes well controlled per Dr. Tonette Bihari notes.  No meds added.     MD- Please consider placing orders for Novolog Sensitive Correction Scale/ SSI (0-9 units) TID AC + HS while patient hospitalized.      --Will follow patient during hospitalization--  Wyn Quaker RN, MSN, CDE Diabetes Coordinator Inpatient Glycemic Control Team Team Pager: [phone] (8a-5p)

## 2017-11-03 NOTE — Consult Note (Signed)
Reason for Consult: R sided weakness  Referring Physician: Dr. Genia Harold   CC: R sided weakness   HPI: [name] is an 62 y.o. male with a known history of hypertension and remote stroke 4 years ago with right-sided weakness that has resolved.  Patient presented to emergency room this time for episodes of tingling to the right arm, going on for the past 2 to 3 days.  Symptoms resolved and pt is back to baseline.  On ASA 81mg  at home     Past Medical History:  Diagnosis Date  . Hypertension   . Stroke [facility])     Past Surgical History:  Procedure Laterality Date  . none      Family History  Problem Relation Age of Onset  . Hypertension Mother   . Hypertension Father     Social History:  reports that he has never smoked. He has never used smokeless tobacco. He reports that he does not drink alcohol or use drugs.  No Known Allergies  Medications: I have reviewed the patient's current medications.  ROS: History obtained from the patient  General ROS: negative for - chills, fatigue, fever, night sweats, weight gain or weight loss Psychological ROS: negative for - behavioral disorder, hallucinations, memory difficulties, mood swings or suicidal ideation Ophthalmic ROS: negative for - blurry vision, double vision, eye pain or loss of vision ENT ROS: negative for - epistaxis, nasal discharge, oral lesions, sore throat, tinnitus or vertigo Allergy and Immunology ROS: negative for - hives or itchy/watery eyes Hematological and Lymphatic ROS: negative for - bleeding problems, bruising or swollen lymph nodes Endocrine ROS: negative for - galactorrhea, hair pattern changes, polydipsia/polyuria or temperature intolerance Respiratory ROS: negative for - cough, hemoptysis, shortness of breath or wheezing Cardiovascular ROS: negative for - chest pain, dyspnea on exertion, edema or irregular heartbeat Gastrointestinal ROS: negative for - abdominal pain, diarrhea, hematemesis, nausea/vomiting  or stool incontinence Genito-Urinary ROS: negative for - dysuria, hematuria, incontinence or urinary frequency/urgency Musculoskeletal ROS: negative for - joint swelling or muscular weakness Neurological ROS: as noted in HPI Dermatological ROS: negative for rash and skin lesion changes  Physical Examination: Blood pressure 130/76, pulse 75, temperature 98.5 F (36.9 C), temperature source Oral, resp. rate 20, height 5\' 10"  (1.778 m), weight 218 lb 12.8 oz (99.2 kg), SpO2 96 %. Neurological Examination   Mental Status: Alert, oriented, thought content appropriate.  Speech fluent without evidence of aphasia.  Able to follow 3 step commands without difficulty. Cranial Nerves: II: Discs flat bilaterally; Visual fields grossly normal, pupils equal, round, reactive to light and accommodation III,IV, VI: ptosis not present, extra-ocular motions intact bilaterally V,VII: smile symmetric, facial light touch sensation normal bilaterally VIII: hearing normal bilaterally IX,X: gag reflex present XI: bilateral shoulder shrug XII: midline tongue extension Motor: Right : Upper extremity   5/5    Left:     Upper extremity   5/5  Lower extremity   5/5     Lower extremity   5/5 Tone and bulk:normal tone throughout; no atrophy noted Sensory: Pinprick and light touch intact throughout, bilaterally Deep Tendon Reflexes: 2+ and symmetric throughout Plantars: Right: downgoing   Left: downgoing Cerebellar: normal finger-to-nose, normal rapid alternating movements and normal heel-to-shin test Gait: not tested       Laboratory Studies:   Basic Metabolic Panel: Recent Labs  Lab 11/02/17 2101 11/03/17 0525  NA 133*  --   K 3.9  --   CL 97*  --   CO2 26  --  GLUCOSE 188*  --   BUN 26*  --   CREATININE 1.33* 1.14  CALCIUM 9.3  --     Liver Function Tests: No results for input(s): AST, ALT, ALKPHOS, BILITOT, PROT, ALBUMIN in the last 168 hours. No results for input(s): LIPASE, AMYLASE in the  last 168 hours. No results for input(s): AMMONIA in the last 168 hours.  CBC: Recent Labs  Lab 11/02/17 2101 11/03/17 0525  WBC 9.8 9.5  NEUTROABS 7.1*  --   HGB 13.6 13.5  HCT 39.6* 39.3*  MCV 90.5 90.3  PLT 318 303    Cardiac Enzymes: No results for input(s): CKTOTAL, CKMB, CKMBINDEX, TROPONINI in the last 168 hours.  BNP: Invalid input(s): POCBNP  CBG: Recent Labs  Lab 11/02/17 2053  GLUCAP 205*    Microbiology: No results found for this or any previous visit.  Coagulation Studies: No results for input(s): LABPROT, INR in the last 72 hours.  Urinalysis: No results for input(s): COLORURINE, LABSPEC, PHURINE, GLUCOSEU, HGBUR, BILIRUBINUR, KETONESUR, PROTEINUR, UROBILINOGEN, NITRITE, LEUKOCYTESUR in the last 168 hours.  Invalid input(s): APPERANCEUR  Lipid Panel:     Component Value Date/Time   CHOL 122 11/03/2017 0525   CHOL 240 (H) 07/23/2013 0243   TRIG 121 11/03/2017 0525   TRIG 245 (H) 07/23/2013 0243   HDL 34 (L) 11/03/2017 0525   HDL 29 (L) 07/23/2013 0243   CHOLHDL 3.6 11/03/2017 0525   VLDL 24 11/03/2017 0525   VLDL 49 (H) 07/23/2013 0243   LDLCALC 64 11/03/2017 0525   LDLCALC 162 (H) 07/23/2013 0243    HgbA1C:  Lab Results  Component Value Date   HGBA1C 6.4 (H) 11/03/2017    Urine Drug Screen:  No results found for: LABOPIA, COCAINSCRNUR, LABBENZ, AMPHETMU, THCU, LABBARB  Alcohol Level: No results for input(s): ETH in the last 168 hours.  Other results: EKG: normal EKG, normal sinus rhythm, unchanged from previous tracings.  Imaging: Mr Brain Wo Contrast  Result Date: 11/03/2017 CLINICAL DATA:  Subacute neuro deficit. Tingling in the right arm for the past 2-3 days. EXAM: MRI HEAD WITHOUT CONTRAST TECHNIQUE: Multiplanar, multiecho pulse sequences of the brain and surrounding structures were obtained without intravenous contrast. COMPARISON:  05/25/2015 FINDINGS: Brain: Stable pattern of mild patchy FLAIR hyperintensity in the cerebral  white matter, likely chronic small vessel ischemia given the patient's medical history. Lacune at the junction of the left internal capsule and lateral thalamus, chronic. No acute infarct, blood products, hydrocephalus, collection, or masslike finding. Vascular: Major flow voids are preserved Skull and upper cervical spine: No evidence of marrow lesion Sinuses/Orbits: Negative IMPRESSION: 1. No acute finding or explanation for symptoms. 2. Chronic small vessel ischemia without noted progression from 2016. Electronically Signed   By: Monte Fantasia M.D.   On: 11/03/2017 11:10   US Carotid Bilateral (at [facility] Only)  Result Date: 11/03/2017 CLINICAL DATA:  Stroke versus TIA symptoms, hypertension EXAM: BILATERAL CAROTID DUPLEX ULTRASOUND TECHNIQUE: Pearline Cables scale imaging, color Doppler and duplex ultrasound were performed of bilateral carotid and vertebral arteries in the neck. COMPARISON:  11/02/2017 head CT without contrast FINDINGS: Criteria: Quantification of carotid stenosis is based on velocity parameters that correlate the residual internal carotid diameter with NASCET-based stenosis levels, using the diameter of the distal internal carotid lumen as the denominator for stenosis measurement. The following velocity measurements were obtained: RIGHT ICA: 53/21 cm/sec CCA: 31/51 cm/sec SYSTOLIC ICA/CCA RATIO:  0.6 ECA:  146 cm/sec LEFT ICA: 61/20 cm/sec CCA: 76/16 cm/sec SYSTOLIC ICA/CCA  RATIO:  0.7 ECA:  106 cm/sec RIGHT CAROTID ARTERY: Minor echogenic shadowing plaque formation. No hemodynamically significant right ICA stenosis, velocity elevation, or turbulent flow. Degree of narrowing less than 50%. RIGHT VERTEBRAL ARTERY:  Antegrade LEFT CAROTID ARTERY: Similar scattered minor echogenic plaque formation. No hemodynamically significant left ICA stenosis, velocity elevation, or turbulent flow. LEFT VERTEBRAL ARTERY:  Antegrade. Hypoechoic left thyroid nodule noted measuring up to 2.6 cm. Heterogeneous left  thyroid gland. Recommend dedicated follow-up thyroid ultrasound non emergently. IMPRESSION: Minor carotid atherosclerosis. No hemodynamically significant ICA stenosis. Degree of narrowing less than 50% bilaterally by ultrasound criteria. Patent antegrade vertebral flow bilaterally 2.6 cm hypoechoic left thyroid nodule. Recommend follow-up thyroid ultrasound. Electronically Signed   By: Jerilynn Mages.  Shick M.D.   On: 11/03/2017 09:51   Ct Head Code Stroke Wo Contrast`  Result Date: 11/02/2017 CLINICAL DATA:  Code stroke. Right leg weakness beginning at 7 p.m., gait imbalance. History of stroke and hypertension. EXAM: CT HEAD WITHOUT CONTRAST TECHNIQUE: Contiguous axial images were obtained from the base of the skull through the vertex without intravenous contrast. COMPARISON:  CT head March 01, 2016 FINDINGS: BRAIN: No intraparenchymal hemorrhage, mass effect nor midline shift. Mild-to-moderate parenchymal brain volume loss. Patchy supratentorial white matter hypodensities. Old left basal ganglia versus internal capsule lacunar infarct. No acute large vascular territory infarcts. No abnormal extra-axial fluid collections. Basal cisterns are patent. VASCULAR: Mild calcific atherosclerosis of the carotid siphons and vertebral arteries. SKULL: No skull fracture. No significant scalp soft tissue swelling. SINUSES/ORBITS: Trace paranasal sinus mucosal thickening. Mastoid air cells are well aerated.The included ocular globes and orbital contents are non-suspicious. OTHER: None. ASPECTS [facility] Stroke Program Early CT Score) - Ganglionic level infarction (caudate, lentiform nuclei, internal capsule, insula, M1-M3 cortex): 7 - Supraganglionic infarction (M4-M6 cortex): 3 Total score (0-10 with 10 being normal): 10 IMPRESSION: 1. No acute intracranial process. 2. ASPECTS is 10. 3. Mild-to-moderate parenchymal brain volume loss, advanced for age. Mild chronic small vessel ischemic changes. Old lacunar infarct. 4. Critical  Value/emergent results were called by telephone at the time of interpretation on 11/02/2017 at 8:53 pm to Dr. Carrie Mew , who verbally acknowledged these results. Electronically Signed   By: Elon Alas M.D.   On: 11/02/2017 20:55     Assessment/Plan:  62 y.o. male with a known history of hypertension and remote stroke 4 years ago with right-sided weakness that has resolved.  Patient presented to emergency room this time for episodes of tingling to the right arm, going on for the past 2 to 3 days.  Symptoms resolved and pt is back to baseline.  On ASA 81mg  at home   MRI no acute abnormalities Pt is back to baseline  ASA 325 daily Statin D/c planning  11/03/2017, 1:12 PM

## 2017-11-03 NOTE — Progress Notes (Signed)
Pt is being discharged home. Discharge papers given and explained to pt and spouse, both verbalized understanding. Meds and f/u appointments reviewed. RX to be picked up from pharmacy.

## 2017-11-03 NOTE — Discharge Summary (Signed)
[place] [facility] [place] NAME: [name]    MR#:  [id number]  DATE OF BIRTH:  31-May-1956  DATE OF ADMISSION:  11/02/2017 ADMITTING PHYSICIAN: Amelia Jo, MD  DATE OF DISCHARGE: 11/03/2017  PRIMARY CARE PHYSICIAN: Kirk Ruths, MD    ADMISSION DIAGNOSIS:  Paresthesia [R20.2]  DISCHARGE DIAGNOSIS:  Active Problems:   Numbness and tingling   SECONDARY DIAGNOSIS:   Past Medical History:  Diagnosis Date  . Hypertension   . Stroke [facility])     HOSPITAL COURSE:   62 y/o male with history of CVA with right residual weakness who presents with numbness and tingling of the right side of his arm and leg.  1.  TIA: Patient symptoms are consistent with TIA.  MRI of the brain was negative for acute stroke.  Carotid Doppler showed no hemodynamically significant stenosis. He was evaluated neurology with recommendations to increase baby aspirin to 325 mg daily.  He will continue on statin. He will follow-up with his neurologist in 1 to 2 weeks.  2.  Essential hypertension: Patient will continue Hyzaar, Norvasc and Coreg   DISCHARGE CONDITIONS AND DIET:   Stable for discharge home on heart healthy diet  CONSULTS OBTAINED:  Treatment Team:  Leotis Pain, MD  DRUG ALLERGIES:  No Known Allergies  DISCHARGE MEDICATIONS:   Allergies as of 11/03/2017   No Known Allergies     Medication List    TAKE these medications   amLODipine 10 MG tablet Commonly known as:  NORVASC Take 10 mg by mouth daily.   aspirin 325 MG EC tablet Take 1 tablet (325 mg total) by mouth daily. Start taking on:  11/04/2017 What changed:    medication strength  how much to take   atorvastatin 20 MG tablet Commonly known as:  LIPITOR Take 40 mg by mouth daily.   carvedilol 6.25 MG tablet Commonly known as:  COREG Take 3.125 mg by mouth 2 (two) times daily with a meal.   gabapentin 100 MG capsule Commonly known as:  NEURONTIN Take 100 mg by mouth  at bedtime.   losartan-hydrochlorothiazide 100-12.5 MG tablet Commonly known as:  HYZAAR Take 1 tablet by mouth daily.   nitroGLYCERIN 0.4 MG SL tablet Commonly known as:  NITROSTAT Place 1 tablet (0.4 mg total) under the tongue every 5 (five) minutes x 3 doses as needed for chest pain.   pantoprazole 40 MG tablet Commonly known as:  PROTONIX Take 1 tablet (40 mg total) by mouth daily.   sucralfate 1 g tablet Commonly known as:  CARAFATE Take 1 tablet (1 g total) by mouth 3 (three) times daily as needed.         Today   CHIEF COMPLAINT:  Patient is doing well this morning.  No acute issues overnight.  All symptoms have resolved.   VITAL SIGNS:  Blood pressure (!) 143/84, pulse 75, temperature 98.4 F (36.9 C), temperature source Oral, resp. rate 20, height 5\' 10"  (1.778 m), weight 99.2 kg (218 lb 12.8 oz), SpO2 99 %.   REVIEW OF SYSTEMS:  Review of Systems  Constitutional: Negative.  Negative for chills, fever and malaise/fatigue.  HENT: Negative.  Negative for ear discharge, ear pain, hearing loss, nosebleeds and sore throat.   Eyes: Negative.  Negative for blurred vision and pain.  Respiratory: Negative.  Negative for cough, hemoptysis, shortness of breath and wheezing.   Cardiovascular: Negative.  Negative for chest pain, palpitations and leg swelling.  Gastrointestinal: Negative.  Negative for abdominal pain, blood in stool, diarrhea, nausea and vomiting.  Genitourinary: Negative.  Negative for dysuria.  Musculoskeletal: Negative.  Negative for back pain.  Skin: Negative.   Neurological: Negative for dizziness, tremors, speech change, focal weakness, seizures and headaches.  Endo/Heme/Allergies: Negative.  Does not bruise/bleed easily.  Psychiatric/Behavioral: Negative.  Negative for depression, hallucinations and suicidal ideas.     PHYSICAL EXAMINATION:  GENERAL:  62 y.o.-year-old patient lying in the bed with no acute distress.  NECK:  Supple, no jugular  venous distention. No thyroid enlargement, no tenderness.  LUNGS: Normal breath sounds bilaterally, no wheezing, rales,rhonchi  No use of accessory muscles of respiration.  CARDIOVASCULAR: S1, S2 normal. No murmurs, rubs, or gallops.  ABDOMEN: Soft, non-tender, non-distended. Bowel sounds present. No organomegaly or mass.  EXTREMITIES: No pedal edema, cyanosis, or clubbing.  PSYCHIATRIC: The patient is alert and oriented x 3.  SKIN: No obvious rash, lesion, or ulcer.  Neuro right sided weakness and facial droop at baseline DATA REVIEW:   CBC Recent Labs  Lab 11/03/17 0525  WBC 9.5  HGB 13.5  HCT 39.3*  PLT 303    Chemistries  Recent Labs  Lab 11/02/17 2101 11/03/17 0525  NA 133*  --   K 3.9  --   CL 97*  --   CO2 26  --   GLUCOSE 188*  --   BUN 26*  --   CREATININE 1.33* 1.14  CALCIUM 9.3  --     Cardiac Enzymes No results for input(s): TROPONINI in the last 168 hours.  Microbiology Results  @[email or web address] @  RADIOLOGY:  Mr Brain Wo Contrast  Result Date: 11/03/2017 CLINICAL DATA:  Subacute neuro deficit. Tingling in the right arm for the past 2-3 days. EXAM: MRI HEAD WITHOUT CONTRAST TECHNIQUE: Multiplanar, multiecho pulse sequences of the brain and surrounding structures were obtained without intravenous contrast. COMPARISON:  05/25/2015 FINDINGS: Brain: Stable pattern of mild patchy FLAIR hyperintensity in the cerebral white matter, likely chronic small vessel ischemia given the patient's medical history. Lacune at the junction of the left internal capsule and lateral thalamus, chronic. No acute infarct, blood products, hydrocephalus, collection, or masslike finding. Vascular: Major flow voids are preserved Skull and upper cervical spine: No evidence of marrow lesion Sinuses/Orbits: Negative IMPRESSION: 1. No acute finding or explanation for symptoms. 2. Chronic small vessel ischemia without noted progression from 2016. Electronically Signed   By: Monte Fantasia M.D.   On:  11/03/2017 11:10   US Carotid Bilateral (at [facility] Only)  Result Date: 11/03/2017 CLINICAL DATA:  Stroke versus TIA symptoms, hypertension EXAM: BILATERAL CAROTID DUPLEX ULTRASOUND TECHNIQUE: Pearline Cables scale imaging, color Doppler and duplex ultrasound were performed of bilateral carotid and vertebral arteries in the neck. COMPARISON:  11/02/2017 head CT without contrast FINDINGS: Criteria: Quantification of carotid stenosis is based on velocity parameters that correlate the residual internal carotid diameter with NASCET-based stenosis levels, using the diameter of the distal internal carotid lumen as the denominator for stenosis measurement. The following velocity measurements were obtained: RIGHT ICA: 53/21 cm/sec CCA: 40/98 cm/sec SYSTOLIC ICA/CCA RATIO:  0.6 ECA:  146 cm/sec LEFT ICA: 61/20 cm/sec CCA: 11/91 cm/sec SYSTOLIC ICA/CCA RATIO:  0.7 ECA:  106 cm/sec RIGHT CAROTID ARTERY: Minor echogenic shadowing plaque formation. No hemodynamically significant right ICA stenosis, velocity elevation, or turbulent flow. Degree of narrowing less than 50%. RIGHT VERTEBRAL ARTERY:  Antegrade LEFT CAROTID ARTERY: Similar scattered minor echogenic plaque formation. No hemodynamically significant left ICA stenosis, velocity elevation, or  turbulent flow. LEFT VERTEBRAL ARTERY:  Antegrade. Hypoechoic left thyroid nodule noted measuring up to 2.6 cm. Heterogeneous left thyroid gland. Recommend dedicated follow-up thyroid ultrasound non emergently. IMPRESSION: Minor carotid atherosclerosis. No hemodynamically significant ICA stenosis. Degree of narrowing less than 50% bilaterally by ultrasound criteria. Patent antegrade vertebral flow bilaterally 2.6 cm hypoechoic left thyroid nodule. Recommend follow-up thyroid ultrasound. Electronically Signed   By: Jerilynn Mages.  Shick M.D.   On: 11/03/2017 09:51   Ct Head Code Stroke Wo Contrast`  Result Date: 11/02/2017 CLINICAL DATA:  Code stroke. Right leg weakness beginning at 7 p.m., gait  imbalance. History of stroke and hypertension. EXAM: CT HEAD WITHOUT CONTRAST TECHNIQUE: Contiguous axial images were obtained from the base of the skull through the vertex without intravenous contrast. COMPARISON:  CT head March 01, 2016 FINDINGS: BRAIN: No intraparenchymal hemorrhage, mass effect nor midline shift. Mild-to-moderate parenchymal brain volume loss. Patchy supratentorial white matter hypodensities. Old left basal ganglia versus internal capsule lacunar infarct. No acute large vascular territory infarcts. No abnormal extra-axial fluid collections. Basal cisterns are patent. VASCULAR: Mild calcific atherosclerosis of the carotid siphons and vertebral arteries. SKULL: No skull fracture. No significant scalp soft tissue swelling. SINUSES/ORBITS: Trace paranasal sinus mucosal thickening. Mastoid air cells are well aerated.The included ocular globes and orbital contents are non-suspicious. OTHER: None. ASPECTS [facility] Stroke Program Early CT Score) - Ganglionic level infarction (caudate, lentiform nuclei, internal capsule, insula, M1-M3 cortex): 7 - Supraganglionic infarction (M4-M6 cortex): 3 Total score (0-10 with 10 being normal): 10 IMPRESSION: 1. No acute intracranial process. 2. ASPECTS is 10. 3. Mild-to-moderate parenchymal brain volume loss, advanced for age. Mild chronic small vessel ischemic changes. Old lacunar infarct. 4. Critical Value/emergent results were called by telephone at the time of interpretation on 11/02/2017 at 8:53 pm to Dr. Carrie Mew , who verbally acknowledged these results. Electronically Signed   By: Elon Alas M.D.   On: 11/02/2017 20:55      Allergies as of 11/03/2017   No Known Allergies     Medication List    TAKE these medications   amLODipine 10 MG tablet Commonly known as:  NORVASC Take 10 mg by mouth daily.   aspirin 325 MG EC tablet Take 1 tablet (325 mg total) by mouth daily. Start taking on:  11/04/2017 What changed:    medication  strength  how much to take   atorvastatin 20 MG tablet Commonly known as:  LIPITOR Take 40 mg by mouth daily.   carvedilol 6.25 MG tablet Commonly known as:  COREG Take 3.125 mg by mouth 2 (two) times daily with a meal.   gabapentin 100 MG capsule Commonly known as:  NEURONTIN Take 100 mg by mouth at bedtime.   losartan-hydrochlorothiazide 100-12.5 MG tablet Commonly known as:  HYZAAR Take 1 tablet by mouth daily.   nitroGLYCERIN 0.4 MG SL tablet Commonly known as:  NITROSTAT Place 1 tablet (0.4 mg total) under the tongue every 5 (five) minutes x 3 doses as needed for chest pain.   pantoprazole 40 MG tablet Commonly known as:  PROTONIX Take 1 tablet (40 mg total) by mouth daily.   sucralfate 1 g tablet Commonly known as:  CARAFATE Take 1 tablet (1 g total) by mouth 3 (three) times daily as needed.           Management plans discussed with the patient and he is in agreement. Stable for discharge home  Patient should follow up with pcp  CODE STATUS:     Code Status  Orders  (From admission, onward)        Start     Ordered   11/03/17 0241  Full code  Continuous     11/03/17 0240    Code Status History    Date Active Date Inactive Code Status Order ID Comments User Context   12/19/2016 0633 12/19/2016 2114 Full Code [id number]  Saundra Shelling, MD Inpatient    Advance Directive Documentation     Most Recent Value  Type of Advance Directive  Healthcare Power of [place], Living will  Pre-existing out of facility DNR order (yellow form or pink MOST form)  -  "MOST" Form in Place?  -      TOTAL TIME TAKING CARE OF THIS PATIENT: 38 minutes.    Note: This dictation was prepared with Dragon dictation along with smaller phrase technology. Any transcriptional errors that result from this process are unintentional.  Anmol Fleck M.D on 11/03/2017 at 11:36 AM  Between 7am to 6pm - Pager - [phone] After 6pm go to [email or web address] - password [email or web address] [facility]  Office  [phone]  CC: Primary care physician; Kirk Ruths, MD

## 2018-07-08 NOTE — Progress Notes (Signed)
07/10/2018  2:07 PM   [name] 1955/10/16 [id number]  Referring provider: Kirk Ruths, MD [facility] [place], [place] [address]  Chief Complaint  Patient presents with  . Elevated PSA    HPI: [name] is a 63 y.o. male with CKD stage II in consultation at the request of Dr. Ouida Sills for high PSA of 18.17 on 06/12/2018.  Patient reports that the PSA taken on 06/12/2018 is his first.  He has urinary frequency; he believes this is as a result of a medication he has been on subsequent to a stroke five years ago which does have that as a side effect.  Patient denies other urinary symptoms.  No family history prostate cancer  PMH: Past Medical History:  Diagnosis Date  . Hypertension   . Stroke [facility])     Surgical History: Past Surgical History:  Procedure Laterality Date  . none      Home Medications:  Allergies as of 07/10/2018   No Known Allergies     Medication List       Accurate as of July 10, 2018  2:07 PM. Always use your most recent med list.        amLODipine 10 MG tablet Commonly known as:  NORVASC Take 10 mg by mouth daily.   aspirin 325 MG EC tablet Take 1 tablet (325 mg total) by mouth daily.   atorvastatin 20 MG tablet Commonly known as:  LIPITOR Take 40 mg by mouth daily.   carvedilol 6.25 MG tablet Commonly known as:  COREG Take 3.125 mg by mouth 2 (two) times daily with a meal.   gabapentin 100 MG capsule Commonly known as:  NEURONTIN Take 100 mg by mouth at bedtime.   hydrochlorothiazide 12.5 MG tablet Commonly known as:  HYDRODIURIL Take by mouth.   losartan 100 MG tablet Commonly known as:  COZAAR Take by mouth.   losartan-hydrochlorothiazide 100-12.5 MG tablet Commonly known as:  HYZAAR Take 1 tablet by mouth daily.       Allergies: No Known Allergies  Family History: Family History  Problem Relation Age of Onset  . Hypertension Mother   . Hypertension Father      Social History:  reports that he has never smoked. He has never used smokeless tobacco. He reports that he does not drink alcohol or use drugs.  ROS: UROLOGY Frequent Urination?: Yes Hard to postpone urination?: No Burning/pain with urination?: No Get up at night to urinate?: Yes Leakage of urine?: No Urine stream starts and stops?: No Trouble starting stream?: No Do you have to strain to urinate?: No Blood in urine?: No Urinary tract infection?: No Sexually transmitted disease?: No Injury to kidneys or bladder?: No Painful intercourse?: No Weak stream?: No Erection problems?: Yes Penile pain?: No  Gastrointestinal Nausea?: No Vomiting?: No Indigestion/heartburn?: No Diarrhea?: No Constipation?: No  Constitutional Fever: No Night sweats?: No Weight loss?: No Fatigue?: No  Skin Skin rash/lesions?: No Itching?: No  Eyes Blurred vision?: No Double vision?: No  Ears/Nose/Throat Sore throat?: No Sinus problems?: No  Hematologic/Lymphatic Swollen glands?: No Easy bruising?: No  Cardiovascular Leg swelling?: No Chest pain?: No  Respiratory Cough?: No Shortness of breath?: No  Endocrine Excessive thirst?: No  Musculoskeletal Back pain?: No Joint pain?: No  Neurological Headaches?: No Dizziness?: No  Psychologic Depression?: No Anxiety?: No  Physical Exam: BP (!) 152/71 (BP Location: Left Arm, Patient Position: Sitting, Cuff Size: Normal)   Pulse 79  Ht 5\' 10"  (1.778 m)   Wt 217 lb 11.2 oz (98.7 kg)   BMI 31.24 kg/m   Constitutional:  Well nourished. Alert and oriented, No acute distress. Cardiovascular: No clubbing, cyanosis, or edema. Respiratory: Normal respiratory effort, no increased work of breathing. Rectal: Patient with normal sphincter tone.  No rectal masses are appreciated. Prostate is approximately 50 grams, smooth, slight asymmetry left greater than right, no nodules are appreciated. Skin: No rashes, bruises or suspicious  lesions. Neurologic: Grossly intact, no focal deficits, moving all 4 extremities. Psychiatric: Normal mood and affect.  Laboratory Data: Lab Results  Component Value Date   WBC 9.5 11/03/2017   HGB 13.5 11/03/2017   HCT 39.3 (L) 11/03/2017   MCV 90.3 11/03/2017   PLT 303 11/03/2017   Lab Results  Component Value Date   CREATININE 1.14 11/03/2017   PSA  06/12/2018, 18.17  Urinalysis Dipstick trace blood Microscopy negative  Assessment & Plan:    1. Elevated PSA - Although PSA is a prostate cancer screening test he was informed that cancer is not the most common cause of an elevated PSA. Other potential causes including BPH and inflammation were discussed. He was informed that the only way to adequately diagnose prostate cancer would be a transrectal ultrasound and biopsy of the prostate. The procedure was discussed including potential risks of bleeding and infection/sepsis. He was also informed that a negative biopsy does not conclusively rule out the possibility that prostate cancer may be present and that continued monitoring is required. The use of newer adjunctive blood tests including PHI and 4kScore were discussed. The use of multiparametric prostate MRI was also discussed however is not typically used for initial evaluation of an elevated PSA. Continued periodic surveillance was also discussed.  - Will treat with 30 day trial of tamsulosin and repeat PSA initially  Return in about 1 month (around 08/08/2018) for Lab visit for repeat PSA.  Abbie Sons, [facility] [address], [place], [place] [address] [phone]  I, Adele Schilder, am acting as a scribe for Matthew Giovanni, MD.    I, Abbie Sons, MD, have reviewed all documentation for this visit. The documentation on 07/10/18 for the exam, diagnosis, procedures, and orders are all accurate and complete.

## 2018-07-10 ENCOUNTER — Other Ambulatory Visit: Payer: Self-pay

## 2018-07-10 ENCOUNTER — Ambulatory Visit: Payer: BC Managed Care – PPO | Admitting: Urology

## 2018-07-10 ENCOUNTER — Encounter: Payer: Self-pay | Admitting: Urology

## 2018-07-10 VITALS — BP 152/71 | HR 79 | Ht 70.0 in | Wt 217.7 lb

## 2018-07-10 DIAGNOSIS — R972 Elevated prostate specific antigen [PSA]: Secondary | ICD-10-CM

## 2018-07-10 MED ORDER — TAMSULOSIN HCL 0.4 MG PO CAPS
0.4000 mg | ORAL_CAPSULE | Freq: Every day | ORAL | 0 refills | Status: DC
Start: 2018-07-10 — End: 2018-12-14

## 2018-07-10 NOTE — Patient Instructions (Signed)

## 2018-07-11 ENCOUNTER — Telehealth: Payer: Self-pay | Admitting: Urology

## 2018-07-11 LAB — URINALYSIS, COMPLETE
BILIRUBIN UA: NEGATIVE
Glucose, UA: NEGATIVE
Ketones, UA: NEGATIVE
LEUKOCYTES UA: NEGATIVE
NITRITE UA: NEGATIVE
PH UA: 5.5 (ref 5.0–7.5)
Protein, UA: NEGATIVE
SPEC GRAV UA: 1.02 (ref 1.005–1.030)
Urobilinogen, Ur: 0.2 mg/dL (ref 0.2–1.0)

## 2018-07-11 NOTE — Telephone Encounter (Signed)
Pt was reading side effects for Tamsulosin that Stoioff prescribed yesterday.  He is currently taking several kinds of blood pressure meds and saw that Tamsulosin can cause low blood pressure.  He wants to know if this is safe to take with his current meds.  Please call pt.

## 2018-07-11 NOTE — Telephone Encounter (Signed)
Patient notified to try taking the medication in the evening if it bothers him. He is going to try Tamsulosin this morning and see how he does.

## 2018-07-14 ENCOUNTER — Telehealth: Payer: Self-pay | Admitting: Urology

## 2018-07-14 NOTE — Telephone Encounter (Signed)
Returned call to patient and advised that Tamsulosin may decrease his BP but should not be causing him to go into Afib, per Judson Roch, and advised him that he should contact his primary care provider. Advised ok to stop Tamsulosin if it makes him feel more comfortable until he can see his PCP. Patient verbalized understanding and will call his PCP

## 2018-07-14 NOTE — Telephone Encounter (Signed)
Pt called and states that he just started Tamsulosin on Monday and when he checks his BP his machine alarms and says he is in AFIB. Please advise.

## 2018-08-11 ENCOUNTER — Other Ambulatory Visit: Payer: Self-pay

## 2018-08-11 ENCOUNTER — Other Ambulatory Visit: Payer: BC Managed Care – PPO

## 2018-08-11 DIAGNOSIS — R972 Elevated prostate specific antigen [PSA]: Secondary | ICD-10-CM

## 2018-08-12 LAB — PSA: Prostate Specific Ag, Serum: 14.1 ng/mL — ABNORMAL HIGH (ref 0.0–4.0)

## 2018-08-14 ENCOUNTER — Telehealth: Payer: Self-pay

## 2018-08-14 NOTE — Telephone Encounter (Signed)
-----   Message from Abbie Sons, MD sent at 08/13/2018 12:50 PM EDT ----- Is a remains persistently elevated at 14.1.  Recommend scheduling prostate biopsy.

## 2018-08-14 NOTE — Telephone Encounter (Signed)
.  mychart

## 2018-08-14 NOTE — Telephone Encounter (Signed)
-----   Message from Abbie Sons, MD sent at 08/13/2018 12:51 PM EDT ----- PSA persistently elevated

## 2018-08-18 NOTE — Telephone Encounter (Signed)
Spoke with patient and explained that we are currently suspending prostate biopsies at this time due to the CoVid virus. We will place him on a recall list and schedule when we are able to go ahead with procedures

## 2018-09-14 ENCOUNTER — Encounter

## 2018-12-14 ENCOUNTER — Other Ambulatory Visit: Payer: Self-pay | Admitting: Urology

## 2018-12-14 ENCOUNTER — Ambulatory Visit (INDEPENDENT_AMBULATORY_CARE_PROVIDER_SITE_OTHER): Payer: BC Managed Care – PPO | Admitting: Urology

## 2018-12-14 ENCOUNTER — Other Ambulatory Visit: Payer: Self-pay

## 2018-12-14 ENCOUNTER — Encounter: Payer: Self-pay | Admitting: Urology

## 2018-12-14 VITALS — BP 163/82 | HR 82 | Ht 70.0 in | Wt 221.6 lb

## 2018-12-14 DIAGNOSIS — R972 Elevated prostate specific antigen [PSA]: Secondary | ICD-10-CM

## 2018-12-14 MED ORDER — LEVOFLOXACIN 500 MG PO TABS
500.0000 mg | ORAL_TABLET | Freq: Once | ORAL | Status: AC
  Administered 2018-12-14: 500 mg via ORAL

## 2018-12-14 MED ORDER — GENTAMICIN SULFATE 40 MG/ML IJ SOLN
80.0000 mg | Freq: Once | INTRAMUSCULAR | Status: AC
  Administered 2018-12-14: 80 mg via INTRAMUSCULAR

## 2018-12-14 NOTE — Progress Notes (Signed)
Prostate Biopsy Procedure  Indications: Elevated PSA 18.16 June 2018 with repeat PSA March 2020 at 14.1  Informed consent was obtained after discussing risks/benefits of the procedure.  A time out was performed to ensure correct patient identity.  Pre-Procedure: - Last PSA Level: 14.30 July 2018 - Gentamicin given prophylactically - Levaquin 500 mg administered PO -Transrectal Ultrasound performed revealing a 103 gm prostate -No significant hypoechoic abnormalities; large intravesical median lobe noted  Procedure: - Prostate block performed using 10 cc 1% lidocaine and biopsies taken from sextant areas, a total of 12 under ultrasound guidance.  Post-Procedure: - Patient tolerated the procedure well - He was counseled to seek immediate medical attention if experiences any severe pain, significant bleeding, or fevers - Return in one week to discuss biopsy results  John Giovanni, MD

## 2018-12-14 NOTE — Patient Instructions (Signed)
Transrectal Ultrasound-Guided Prostate Biopsy, Care After °This sheet gives you information about how to care for yourself after your procedure. Your doctor may also give you more specific instructions. If you have problems or questions, contact your doctor. °What can I expect after the procedure? °After the procedure, it is common to have: °· Pain and discomfort in your butt, especially while sitting. °· Pink-colored pee (urine), due to small amounts of blood in the pee. °· Burning while peeing (urinating). °· Blood in your poop (stool). °· Bleeding from your butt. °· Blood in your semen. °Follow these instructions at home: °Medicines °· Take over-the-counter and prescription medicines only as told by your doctor. °· If you were prescribed antibiotic medicine, take it as told by your doctor. Do not stop taking the antibiotic even if you start to feel better. °Activity ° °· Do not drive for 24 hours if you were given a medicine to help you relax (sedative) during your procedure. °· Return to your normal activities as told by your doctor. Ask your doctor what activities are safe for you. °· Ask your doctor when it is okay for you to have sex. °· Do not lift anything that is heavier than 10 lb (4.5 kg), or the limit that you are told, until your doctor says that it is safe. °General instructions ° °· Drink enough water to keep your pee pale yellow. °· Watch your pee, poop, and semen for new bleeding or bleeding that gets worse. °· Keep all follow-up visits as told by your doctor. This is important. °Contact a doctor if you: °· Have blood clots in your pee or poop. °· Notice that your pee smells bad or unusual. °· Have very bad belly pain. °· Have trouble peeing. °· Notice that your lower belly feels firm. °· Have blood in your pee for more than 2 weeks after the procedure. °· Have blood in your semen for more than 2 months after the procedure. °· Have problems getting an erection. °· Feel sick to your stomach  (nauseous). °· Throw up (vomit). °· Have new or worse bleeding in your pee, poop, or semen. °Get help right away if you: °· Have a fever or chills. °· Have bright red pee. °· Have very bad pain that does not get better with medicine. °· Cannot pee. °Summary °· After this procedure, it is common to have pain and discomfort around your butt, especially while sitting. °· You may have blood in your pee and poop. °· It is common to have blood in your semen for 1-2 months. °· If you were prescribed antibiotic medicine, take it as told by your doctor. Do not stop taking the antibiotic even if you start to feel better. °· Get help right away if you have a fever or chills. °This information is not intended to replace advice given to you by your health care provider. Make sure you discuss any questions you have with your health care provider. °Document Released: 03/15/2017 Document Revised: 09/06/2018 Document Reviewed: 03/15/2017 °Elsevier Patient Education © 2020 Elsevier Inc. ° °

## 2018-12-19 ENCOUNTER — Encounter: Payer: Self-pay | Admitting: Urology

## 2018-12-26 LAB — ANATOMIC PATHOLOGY REPORT: PDF Image: 0

## 2018-12-29 ENCOUNTER — Encounter: Payer: Self-pay | Admitting: Urology

## 2018-12-29 ENCOUNTER — Other Ambulatory Visit: Payer: Self-pay

## 2018-12-29 ENCOUNTER — Ambulatory Visit: Payer: BC Managed Care – PPO | Admitting: Urology

## 2018-12-29 VITALS — BP 157/83 | HR 94 | Ht 70.0 in | Wt 217.0 lb

## 2018-12-29 DIAGNOSIS — C61 Malignant neoplasm of prostate: Secondary | ICD-10-CM | POA: Diagnosis not present

## 2018-12-29 NOTE — Progress Notes (Signed)
12/29/2018 3:17 PM   [name] [birth year] [id number]  Referring provider: Kirk Ruths, MD [facility] [place],  [place] [address]  Chief Complaint  Patient presents with  . Results    HPI: [name] presents for prostate biopsy results.  Biopsy was performed on 12/14/2018 for PSA of 18.17 and benign DRE.  Prostate volume 130 g.  Standard 12 core biopsies were performed.  He had no post biopsy problems.  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   PMH: Past Medical History:  Diagnosis Date  . Hypertension   . Stroke [facility])     Surgical History: Past Surgical History:  Procedure Laterality Date  . none      Home Medications:  Allergies as of 12/29/2018   No Known Allergies     Medication List       Accurate as of December 29, 2018  3:17 PM. If you have any questions, ask your nurse or doctor.        amLODipine 10 MG tablet Commonly known as: NORVASC Take 10 mg by mouth daily.   aspirin 81 MG EC tablet   atorvastatin 40 MG tablet Commonly known as: LIPITOR Take 40 mg by mouth daily.   carvedilol 6.25 MG tablet Commonly known as: COREG Take 3.125 mg by mouth 2 (two) times daily with a meal.   gabapentin 100 MG capsule Commonly known as: NEURONTIN Take 100 mg by mouth 2 (two) times daily.   hydrochlorothiazide 12.5 MG tablet Commonly known as: HYDRODIURIL Take by mouth.   losartan 100 MG tablet Commonly known as: COZAAR Take by mouth.       Allergies: No Known Allergies  Family History: Family History  Problem Relation Age of Onset  . Hypertension Mother   . Hypertension Father     Social History:  reports that he has never smoked. He has never used smokeless tobacco. He reports that he does not drink alcohol or use drugs.  ROS: UROLOGY Frequent Urination?: No Hard to postpone urination?: No  Burning/pain with urination?: No Get up at night to urinate?: No Leakage of urine?: No Urine stream starts and stops?: No Trouble starting stream?: No Do you have to strain to urinate?: No Blood in urine?: No Urinary tract infection?: No Sexually transmitted disease?: No Injury to kidneys or bladder?: No Painful intercourse?: No Weak stream?: No Erection problems?: No Penile pain?: No  Gastrointestinal Nausea?: No Vomiting?: No Indigestion/heartburn?: No Diarrhea?: No Constipation?: No  Constitutional Fever: No Night sweats?: No Weight loss?: No Fatigue?: No  Skin Skin rash/lesions?: No Itching?: No  Eyes Blurred vision?: No Double vision?: No  Ears/Nose/Throat Sore throat?: No Sinus problems?: No  Hematologic/Lymphatic Swollen glands?: No Easy bruising?: No  Cardiovascular Leg swelling?: No Chest pain?: No  Respiratory Cough?: No Shortness of breath?: No  Endocrine Excessive thirst?: No  Musculoskeletal Back pain?: No Joint pain?: No  Neurological Headaches?: No Dizziness?: No  Psychologic Depression?: No Anxiety?: No  Physical Exam: BP (!) 157/83 (BP Location: Left Arm, Patient Position: Sitting, Cuff Size: Normal)   Pulse 94   Ht 5\' 10"  (1.778 m)   Wt 217 lb (98.4 kg)   BMI 31.14 kg/m   Constitutional:  Alert and oriented, No acute distress. HEENT: [place] AT, moist mucus membranes.  Trachea midline, no masses. Cardiovascular: No clubbing, cyanosis, or edema. Respiratory: Normal respiratory effort, no  increased work of breathing.   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  He is an excellent candidate for active surveillance which was discussed in detail.  Based on his prostate volume would consider adding finasteride.  Curative options of radical prostatectomy, IMRT and brachytherapy were also discussed.  He was provided  literature on diagnosis and treatment of prostate cancer.  He would like to think over these options and will call back for any questions and with his decision.  Greater than 50% of this 15-minute visit was spent counseling the patient.   Abbie Sons, [facility] [address], [place], [place] [address] [phone]

## 2018-12-29 NOTE — Patient Instructions (Signed)
Prostate Cancer  The prostate is a walnut-sized gland that is involved in the production of semen. It is located below a man's bladder, in front of the rectum. Prostate cancer is the abnormal growth of cells in the prostate gland. What are the causes? The exact cause of this condition is not known. What increases the risk? This condition is more likely to develop in men who:  Are older than age 65.  Are African-American.  Are obese.  Have a family history of prostate cancer.  Have a family history of breast cancer. What are the signs or symptoms? Symptoms of this condition include:  A need to urinate often.  Weak or interrupted flow of urine.  Trouble starting or stopping urination.  Inability to urinate.  Pain or burning during urination.  Painful ejaculation.  Blood in urine or semen.  Persistent pain or discomfort in the lower back, lower abdomen, hips, or upper thighs.  Trouble getting an erection.  Trouble emptying the bladder all the way. How is this diagnosed? This condition can be diagnosed with:  A digital rectal exam. For this exam, a health care provider inserts a gloved finger into the rectum to feel the prostate gland.  A blood test called a prostate-specific antigen (PSA) test.  An imaging test called transrectal ultrasonography.  A procedure in which a sample of tissue is taken from the prostate and examined under a microscope (prostate biopsy). Once the condition is diagnosed, tests will be done to determine how far the cancer has spread. This is called staging the cancer. Staging may involve imaging tests, such as:  A bone scan.  A CT scan.  A PET scan.  An MRI. The stages of prostate cancer are as follows:  Stage I. At this stage, the cancer is found in the prostate only. The cancer is not visible on imaging tests and it is usually found by accident, such as during a prostate surgery.  Stage II. At this stage, the cancer is more advanced  than it is in stage I, but the cancer has not spread outside the prostate.  Stage III. At this stage, the cancer has spread beyond the outer layer of the prostate to nearby tissues. The cancer may be found in the seminal vesicles, which are near the bladder and the prostate.  Stage IV. At this stage, the cancer has spread other parts of the body, such as the lymph nodes, bones, bladder, rectum, liver, or lungs. How is this treated? Treatment for this condition depends on several factors, including the stage of the cancer, your age, personal preferences, and your overall health. Talk with your health care provider about treatment options that are recommended for you. Common treatments include:  Observation for early stage prostate cancer (active surveillance). This involves having exams, blood tests, and in some cases, more biopsies. For some men, this is the only treatment needed.  Surgery. Types of surgeries include: ? Open surgery. In this surgery, a larger incision is made to remove the prostate. ? A laparoscopic prostatectomy. This is a surgery to remove the prostate and lymph nodes through several, small incisions. It is often referred to as a minimally invasive surgery. ? A robotic prostatectomy. This is a surgery to remove the prostate and lymph nodes with the help of a robotic arm that is controlled by a computer. ? Orchiectomy. This is a surgery to remove the testicles. ? Cryosurgery. This is a surgery to freeze and destroy cancer cells.  Radiation treatment. Types   of radiation treatment include: ? External beam radiation. This type aims beams of radiation from outside the body at the prostate to destroy cancerous cells. ? Brachytherapy. This type uses radioactive needles, seeds, wires, or tubes that are implanted into the prostate gland. Like external beam radiation, brachytherapy destroys cancerous cells. An advantage is that this type of radiation limits the damage to surrounding  tissue and has fewer side effects.  High-intensity, focused ultrasonography. This treatment destroys cancer cells by delivering high-energy ultrasound waves to the cancerous cells.  Chemotherapy medicines. This treatment kills cancer cells or stops them from multiplying.  Hormone treatment. This treatment involves taking medicines that act on one of the male hormones (testosterone): ? By stopping your body from producing testosterone. ? By blocking testosterone from reaching cancer cells. Follow these instructions at home:  Take over-the-counter and prescription medicines only as told by your health care provider.  Maintain a healthy diet.  Get plenty of sleep.  Consider joining a support group for men who have prostate cancer. Meeting with a support group may help you learn to cope with the stress of having cancer.  Keep all follow-up visits as told by your health care provider. This is important.  If you have to go to the hospital, notify your cancer specialist (oncologist).  Treatment for prostate cancer may affect sexual function. Continue to have intimate moments with your partner. This may include touching, holding, hugging, and caressing. Contact a health care provider if:  You have trouble urinating.  You have blood in your urine.  You have pain in your hips, back, or chest. Get help right away if:  You have weakness or numbness in your legs.  You cannot control urination or your bowel movements (incontinence).  You have trouble breathing.  You have sudden chest pain.  You have chills or a fever. Summary  The prostate is a walnut-sized gland that is involved in the production of semen. It is located below a man's bladder, in front of the rectum. Prostate cancer is the abnormal growth of cells in the prostate gland.  Treatment for this condition depends on several factors, including the stage of the cancer, your age, personal preferences, and your overall health.  Talk with your health care provider about treatment options that are recommended for you.  Consider joining a support group for men who have prostate cancer. Meeting with a support group may help you learn to cope with the stress of having cancer. This information is not intended to replace advice given to you by your health care provider. Make sure you discuss any questions you have with your health care provider. Document Released: 05/17/2005 Document Revised: 04/29/2017 Document Reviewed: 01/26/2016 Elsevier Patient Education  2020 Elsevier Inc.  

## 2019-01-04 ENCOUNTER — Other Ambulatory Visit: Payer: Self-pay | Admitting: Urology

## 2019-01-08 ENCOUNTER — Encounter: Payer: Self-pay | Admitting: Urology

## 2019-01-11 ENCOUNTER — Encounter: Payer: Self-pay | Admitting: Urology

## 2019-01-11 ENCOUNTER — Other Ambulatory Visit: Payer: Self-pay | Admitting: Urology

## 2019-01-11 DIAGNOSIS — N401 Enlarged prostate with lower urinary tract symptoms: Secondary | ICD-10-CM

## 2019-01-11 DIAGNOSIS — C61 Malignant neoplasm of prostate: Secondary | ICD-10-CM | POA: Insufficient documentation

## 2019-01-11 MED ORDER — FINASTERIDE 5 MG PO TABS: 5.0000 mg | ORAL_TABLET | Freq: Every day | ORAL | 1 refills | Status: DC

## 2019-04-02 ENCOUNTER — Encounter: Payer: Self-pay | Admitting: Urology

## 2019-04-03 ENCOUNTER — Other Ambulatory Visit: Payer: Self-pay | Admitting: Urology

## 2019-04-11 ENCOUNTER — Other Ambulatory Visit: Payer: Self-pay

## 2019-04-11 DIAGNOSIS — C61 Malignant neoplasm of prostate: Secondary | ICD-10-CM

## 2019-04-12 ENCOUNTER — Other Ambulatory Visit: Payer: BC Managed Care – PPO

## 2019-04-12 ENCOUNTER — Other Ambulatory Visit: Payer: Self-pay

## 2019-04-12 DIAGNOSIS — C61 Malignant neoplasm of prostate: Secondary | ICD-10-CM

## 2019-04-13 LAB — PSA: Prostate Specific Ag, Serum: 10.1 ng/mL — ABNORMAL HIGH (ref 0.0–4.0)

## 2019-04-16 ENCOUNTER — Ambulatory Visit: Payer: BC Managed Care – PPO | Admitting: Urology

## 2019-04-17 ENCOUNTER — Ambulatory Visit: Payer: BC Managed Care – PPO | Admitting: Urology

## 2019-04-23 ENCOUNTER — Encounter: Payer: Self-pay | Admitting: Urology

## 2019-04-23 ENCOUNTER — Ambulatory Visit: Payer: BC Managed Care – PPO | Admitting: Urology

## 2019-04-23 ENCOUNTER — Other Ambulatory Visit: Payer: Self-pay

## 2019-04-23 VITALS — BP 158/84 | HR 88 | Ht 70.0 in | Wt 223.0 lb

## 2019-04-23 DIAGNOSIS — C61 Malignant neoplasm of prostate: Secondary | ICD-10-CM | POA: Diagnosis not present

## 2019-04-23 DIAGNOSIS — N401 Enlarged prostate with lower urinary tract symptoms: Secondary | ICD-10-CM | POA: Insufficient documentation

## 2019-04-23 MED ORDER — FINASTERIDE 5 MG PO TABS: 5.0000 mg | ORAL_TABLET | Freq: Every day | ORAL | 1 refills | Status: DC

## 2019-04-23 NOTE — Progress Notes (Signed)
04/23/2019 8:57 AM   [name] [birth year] [id number]  Referring provider: Kirk Ruths, MD [facility] [place],  [place] [address]  Chief Complaint  Patient presents with  . Elevated PSA    Urologic history: 1.  T1c low risk prostate cancer -Biopsy 11/2018 PSA 18.17; prostate volume 130 g -4/12 cores positive Gleason 3+3 adenocarcinoma -left mid (50%), left apex (3%), left base (40%) and left lateral mid (10%) -After discussing management options elected active surveillance -Started finasteride August 2020  HPI: 63 y.o. male presents for 67-month follow-up.  Since his last visit he states he is voiding well with a better stream and less nocturia.  He has been on finasteride for approximately 3 months.  A PSA drawn 04/12/2019 was 10.1.   PMH: Past Medical History:  Diagnosis Date  . Hypertension   . Stroke [facility])     Surgical History: Past Surgical History:  Procedure Laterality Date  . none      Home Medications:  Allergies as of 04/23/2019   No Known Allergies     Medication List       Accurate as of April 23, 2019  8:57 AM. If you have any questions, ask your nurse or doctor.        amLODipine 10 MG tablet Commonly known as: NORVASC Take 10 mg by mouth daily.   aspirin 81 MG EC tablet   atorvastatin 40 MG tablet Commonly known as: LIPITOR Take 40 mg by mouth daily.   carvedilol 6.25 MG tablet Commonly known as: COREG Take 3.125 mg by mouth 2 (two) times daily with a meal.   finasteride 5 MG tablet Commonly known as: PROSCAR Take 1 tablet (5 mg total) by mouth daily.   gabapentin 100 MG capsule Commonly known as: NEURONTIN Take 100 mg by mouth 2 (two) times daily.   losartan 100 MG tablet Commonly known as: COZAAR TAKE 1 TABLET BY MOUTH ONCE DAILY       Allergies: No Known Allergies  Family History: Family History  Problem Relation Age of Onset  . Hypertension Mother   . Hypertension  Father     Social History:  reports that he has never smoked. He has never used smokeless tobacco. He reports that he does not drink alcohol or use drugs.  ROS: UROLOGY Frequent Urination?: No Hard to postpone urination?: No Burning/pain with urination?: No Get up at night to urinate?: Yes Leakage of urine?: No Urine stream starts and stops?: No Trouble starting stream?: No Do you have to strain to urinate?: No Blood in urine?: No Urinary tract infection?: No Sexually transmitted disease?: No Injury to kidneys or bladder?: No Painful intercourse?: No Weak stream?: No Erection problems?: No Penile pain?: No  Gastrointestinal Nausea?: No Vomiting?: No Indigestion/heartburn?: No Diarrhea?: No Constipation?: No  Constitutional Fever: No Night sweats?: No Weight loss?: No Fatigue?: No  Skin Skin rash/lesions?: No Itching?: No  Eyes Blurred vision?: No Double vision?: No  Ears/Nose/Throat Sore throat?: No Sinus problems?: No  Hematologic/Lymphatic Swollen glands?: No Easy bruising?: No  Cardiovascular Leg swelling?: No Chest pain?: No  Respiratory Cough?: No Shortness of breath?: No  Endocrine Excessive thirst?: No  Musculoskeletal Back pain?: No Joint pain?: No  Neurological Headaches?: No Dizziness?: No  Psychologic Depression?: No Anxiety?: No  Physical Exam: BP (!) 158/84   Pulse 88   Ht 5\' 10"  (1.778 m)   Wt 223 lb (101.2 kg)   BMI 32.00 kg/m  Constitutional:  Alert and oriented, No acute distress. HEENT: [place] AT, moist mucus membranes.  Trachea midline, no masses. Cardiovascular: No clubbing, cyanosis, or edema. Respiratory: Normal respiratory effort, no increased work of breathing. GI: Abdomen is soft, nontender, nondistended, no abdominal masses GU: Prostate 50 cc, smooth without nodules. Skin: No rashes, bruises or suspicious lesions. Neurologic: Grossly intact, no focal deficits, moving all 4 extremities. Psychiatric:  Normal mood and affect.   Assessment & Plan:    - Clinical T1c low risk prostate cancer On finasteride for approximately 3 months with an appropriate PSA downward trend.  Benign DRE.  We discussed confirmatory biopsy at 9-12 months from initial biopsy.  I recommended 39-month follow-up with a prostate MRI and earlier biopsy if any suspicious lesions identified.  - BPH with lower urinary tract symptoms Significant prostate enlargement with improved voiding symptoms on finasteride.  Finasteride was refilled.   Abbie Sons, [place] [address] [place], [place] [address] [phone]

## 2019-08-17 ENCOUNTER — Ambulatory Visit: Payer: BC Managed Care – PPO | Attending: Internal Medicine

## 2019-08-17 DIAGNOSIS — Z23 Encounter for immunization: Secondary | ICD-10-CM

## 2019-08-17 NOTE — Progress Notes (Signed)
   Covid-19 Vaccination Clinic  Name:  [name]    MRN: [id number] DOB: [birth year]  08/17/2019  [name] was observed post Covid-19 immunization for 15 minutes without incident. He was provided with Vaccine Information Sheet and instruction to access the V-Safe system.   [name] was instructed to call 911 with any severe reactions post vaccine: Marland Kitchen Difficulty breathing  . Swelling of face and throat  . A fast heartbeat  . A bad rash all over body  . Dizziness and weakness   Immunizations Administered    Name Date Dose VIS Date Route   Pfizer COVID-19 Vaccine 08/17/2019 11:03 AM 0.3 mL 05/11/2019 Intramuscular   Manufacturer: [place]   Lot: [id number]   [place]: [id number]

## 2019-08-22 ENCOUNTER — Ambulatory Visit: Payer: BC Managed Care – PPO | Admitting: Urology

## 2019-08-23 ENCOUNTER — Other Ambulatory Visit: Payer: Self-pay | Admitting: Urology

## 2019-08-23 DIAGNOSIS — C61 Malignant neoplasm of prostate: Secondary | ICD-10-CM

## 2019-09-11 ENCOUNTER — Ambulatory Visit: Payer: Medicare PPO | Attending: Internal Medicine

## 2019-09-11 DIAGNOSIS — Z23 Encounter for immunization: Secondary | ICD-10-CM

## 2019-09-11 NOTE — Progress Notes (Signed)
   Covid-19 Vaccination Clinic  Name:  [name]    MRN: [id number] DOB: 10-15-1955  09/11/2019  [name] was observed post Covid-19 immunization for 15 minutes without incident. He was provided with Vaccine Information Sheet and instruction to access the V-Safe system.   [name] was instructed to call 911 with any severe reactions post vaccine: Marland Kitchen Difficulty breathing  . Swelling of face and throat  . A fast heartbeat  . A bad rash all over body  . Dizziness and weakness   Immunizations Administered    Name Date Dose VIS Date Route   Pfizer COVID-19 Vaccine 09/11/2019 11:52 AM 0.3 mL 05/11/2019 Intramuscular   Manufacturer: [place]   Lot: [id number]   [place]: [id number]

## 2019-09-17 ENCOUNTER — Ambulatory Visit: Payer: Medicare PPO

## 2019-09-26 ENCOUNTER — Ambulatory Visit
Admission: RE | Admit: 2019-09-26 | Discharge: 2019-09-26 | Disposition: A | Discharge status: Home / Self Care | Payer: Medicare PPO | Source: Ambulatory Visit | Attending: Urology | Admitting: Urology

## 2019-09-26 ENCOUNTER — Other Ambulatory Visit: Payer: Self-pay

## 2019-09-26 DIAGNOSIS — C61 Malignant neoplasm of prostate: Secondary | ICD-10-CM | POA: Diagnosis not present

## 2019-09-26 MED ORDER — GADOBUTROL 1 MMOL/ML IV SOLN
10.0000 mL | Freq: Once | INTRAVENOUS | Status: AC | PRN
  Administered 2019-09-26: 10 mL via INTRAVENOUS

## 2019-09-28 ENCOUNTER — Encounter: Payer: Self-pay | Admitting: Urology

## 2019-09-28 ENCOUNTER — Other Ambulatory Visit: Payer: Self-pay

## 2019-09-28 ENCOUNTER — Ambulatory Visit: Payer: Medicare PPO | Admitting: Urology

## 2019-09-28 VITALS — BP 160/81 | HR 82 | Ht 70.0 in | Wt 225.0 lb

## 2019-09-28 DIAGNOSIS — N401 Enlarged prostate with lower urinary tract symptoms: Secondary | ICD-10-CM | POA: Diagnosis not present

## 2019-09-28 DIAGNOSIS — C61 Malignant neoplasm of prostate: Secondary | ICD-10-CM | POA: Diagnosis not present

## 2019-09-28 MED ORDER — FINASTERIDE 5 MG PO TABS: 5.0000 mg | ORAL_TABLET | Freq: Every day | ORAL | 3 refills | Status: DC

## 2019-09-28 NOTE — Progress Notes (Signed)
09/28/2019 9:16 AM   [name] 22-Apr-1956 [id number]  Referring provider: Kirk Ruths, MD [facility] [place],  [place] [address]  Chief Complaint  Patient presents with  . Follow-up    Urologic history: 1.  T1c low risk prostate cancer -Biopsy 11/2018 PSA 18.17; prostate volume 130 g -4/12 cores positive Gleason 3+3 adenocarcinoma -left mid (50%), left apex (3%), left base (40%) and left lateral mid (10%) -After discussing management options elected active surveillance -Started finasteride August 2020   HPI: 63 y.o. male with the above urologic history presents for follow-up of prostate cancer.  -Prostate MRI 09/26/2019 -110 cc volume, no suspicious lesions identified; blood versus proteinaceous material in the left seminal vesicle likely related to prior biopsy; 9 mm left pelvic lymph node noted, nonspecific; area left proximal femur with enhancement and peripheral signal loss   PMH: Past Medical History:  Diagnosis Date  . Hypertension   . Stroke [facility])     Surgical History: Past Surgical History:  Procedure Laterality Date  . none      Home Medications:  Allergies as of 09/28/2019   No Known Allergies     Medication List       Accurate as of September 28, 2019  9:16 AM. If you have any questions, ask your nurse or doctor.        amLODipine 10 MG tablet Commonly known as: NORVASC Take 10 mg by mouth daily.   aspirin 81 MG EC tablet   atorvastatin 40 MG tablet Commonly known as: LIPITOR Take 40 mg by mouth daily.   carvedilol 6.25 MG tablet Commonly known as: COREG Take 3.125 mg by mouth 2 (two) times daily with a meal.   finasteride 5 MG tablet Commonly known as: PROSCAR Take 1 tablet (5 mg total) by mouth daily.   gabapentin 100 MG capsule Commonly known as: NEURONTIN Take 100 mg by mouth 2 (two) times daily.   hydrochlorothiazide 12.5 MG tablet Commonly known as: HYDRODIURIL Take 12.5 mg by mouth  daily.   losartan 100 MG tablet Commonly known as: COZAAR TAKE 1 TABLET BY MOUTH ONCE DAILY       Allergies: No Known Allergies  Family History: Family History  Problem Relation Age of Onset  . Hypertension Mother   . Hypertension Father     Social History:  reports that he has never smoked. He has never used smokeless tobacco. He reports that he does not drink alcohol or use drugs.   Physical Exam: BP (!) 160/81   Pulse 82   Ht 5\' 10"  (1.778 m)   Wt 225 lb (102.1 kg)   BMI 32.28 kg/m   Constitutional:  Alert and oriented, No acute distress. HEENT: [place] AT, moist mucus membranes.  Trachea midline, no masses. Cardiovascular: No clubbing, cyanosis, or edema. Respiratory: Normal respiratory effort, no increased work of breathing. Skin: No rashes, bruises or suspicious lesions. Neurologic: Grossly intact, no focal deficits, moving all 4 extremities. Psychiatric: Normal mood and affect.   Assessment & Plan:    - Low risk prostate cancer Prostate MRI does show a small left pelvic lymph node which is nonspecific.  Also an area in the left proximal femur which needs further evaluation.  Will schedule a dedicated musculoskeletal MRI assessment of this area.  If nothing suspicious for metastatic disease recommend radiation oncology evaluation with findings of the slightly enlarged pelvic lymph node.   Abbie Sons, [facility] [address]  [address] [place] [address], [place], [place] [address] [phone]

## 2019-11-06 ENCOUNTER — Other Ambulatory Visit: Payer: Self-pay

## 2019-11-06 ENCOUNTER — Ambulatory Visit
Admission: RE | Admit: 2019-11-06 | Discharge: 2019-11-06 | Disposition: A | Discharge status: Home / Self Care | Payer: Medicare PPO | Source: Ambulatory Visit | Attending: Urology | Admitting: Urology

## 2019-11-06 DIAGNOSIS — C61 Malignant neoplasm of prostate: Secondary | ICD-10-CM

## 2019-11-06 MED ORDER — GADOBUTROL 1 MMOL/ML IV SOLN
10.0000 mL | Freq: Once | INTRAVENOUS | Status: AC | PRN
  Administered 2019-11-06: 10 mL via INTRAVENOUS

## 2019-11-12 ENCOUNTER — Telehealth: Payer: Self-pay | Admitting: *Deleted

## 2019-11-12 ENCOUNTER — Telehealth: Payer: Self-pay | Admitting: Urology

## 2019-11-12 NOTE — Telephone Encounter (Signed)
App made and mailed °

## 2019-11-12 NOTE — Telephone Encounter (Signed)
-----   Message from Abbie Sons, MD sent at 11/10/2019 12:06 PM EDT ----- MRI showed no abnormalities of the thigh bone. Needs a follow-up appointment with PSA November 2021

## 2019-11-12 NOTE — Telephone Encounter (Signed)
Notified patient as instructed, patient pleased. Discussed follow-up appointments, patient agrees  

## 2020-04-11 ENCOUNTER — Other Ambulatory Visit: Payer: Self-pay

## 2020-04-11 DIAGNOSIS — C61 Malignant neoplasm of prostate: Secondary | ICD-10-CM

## 2020-04-11 DIAGNOSIS — R972 Elevated prostate specific antigen [PSA]: Secondary | ICD-10-CM

## 2020-04-14 ENCOUNTER — Other Ambulatory Visit: Payer: Self-pay

## 2020-04-14 ENCOUNTER — Other Ambulatory Visit: Payer: Medicare PPO

## 2020-04-14 DIAGNOSIS — C61 Malignant neoplasm of prostate: Secondary | ICD-10-CM

## 2020-04-14 DIAGNOSIS — R972 Elevated prostate specific antigen [PSA]: Secondary | ICD-10-CM

## 2020-04-15 LAB — PSA: Prostate Specific Ag, Serum: 5.9 ng/mL — ABNORMAL HIGH (ref 0.0–4.0)

## 2020-04-17 ENCOUNTER — Encounter: Payer: Self-pay | Admitting: Urology

## 2020-04-17 ENCOUNTER — Other Ambulatory Visit: Payer: Self-pay

## 2020-04-17 ENCOUNTER — Ambulatory Visit: Payer: Medicare PPO | Admitting: Urology

## 2020-04-17 VITALS — BP 176/83 | HR 89 | Ht 70.0 in | Wt 222.0 lb

## 2020-04-17 DIAGNOSIS — N401 Enlarged prostate with lower urinary tract symptoms: Secondary | ICD-10-CM

## 2020-04-17 DIAGNOSIS — C61 Malignant neoplasm of prostate: Secondary | ICD-10-CM | POA: Diagnosis not present

## 2020-04-17 NOTE — Progress Notes (Signed)
04/17/2020 2:09 PM   [name] Nov 25, 1955 [id number]  Referring provider: Kirk Ruths, MD [place] [facility] [name],  [place] [address]  Chief Complaint  Patient presents with  . Prostate Cancer    Urologic history: 1.T1c low risk prostate cancer -Biopsy 11/2018 PSA 18.17; prostate volume 130 g -4/12 cores positive Gleason 3+3 adenocarcinoma -left mid (50%), left apex (3%), left base (40%) and left lateral mid (10%) -After discussing management options elected active surveillance -Started finasteride August 2020 -Prostate MRI 09/26/2019; 110 cc volume; no suspicious lesions for high-grade cancer; area left proximal femur with enhancement; MRI femur felt to represent an incidental chondroid lesion, no findings suspicious for metastatic disease  HPI: 64 y.o. male presents for follow-up of prostate cancer.   No complaints since last visit  Denies bothersome LUTS  Remains on finasteride  Denies dysuria, gross hematuria  No flank, abdominal or pelvic pain  PSA 04/14/2020 5.9 (uncorrected)     PMH: Past Medical History:  Diagnosis Date  . Hypertension   . Stroke [facility])     Surgical History: Past Surgical History:  Procedure Laterality Date  . none      Home Medications:  Allergies as of 04/17/2020   No Known Allergies     Medication List       Accurate as of April 17, 2020  2:09 PM. If you have any questions, ask your nurse or doctor.        amLODipine 10 MG tablet Commonly known as: NORVASC Take 10 mg by mouth daily.   aspirin 81 MG EC tablet   atorvastatin 40 MG tablet Commonly known as: LIPITOR Take 40 mg by mouth daily.   carvedilol 6.25 MG tablet Commonly known as: COREG Take 3.125 mg by mouth 2 (two) times daily with a meal.   finasteride 5 MG tablet Commonly known as: PROSCAR Take 1 tablet (5 mg total) by mouth daily.   gabapentin 100 MG capsule Commonly known as: NEURONTIN Take 100 mg by  mouth 2 (two) times daily.   hydrochlorothiazide 12.5 MG tablet Commonly known as: HYDRODIURIL Take 12.5 mg by mouth daily.   losartan 100 MG tablet Commonly known as: COZAAR TAKE 1 TABLET BY MOUTH ONCE DAILY       Allergies: No Known Allergies  Family History: Family History  Problem Relation Age of Onset  . Hypertension Mother   . Hypertension Father     Social History:  reports that he has never smoked. He has never used smokeless tobacco. He reports that he does not drink alcohol and does not use drugs.   Physical Exam: BP (!) 176/83   Pulse 89   Ht 5\' 10"  (1.778 m)   Wt 222 lb (100.7 kg)   BMI 31.85 kg/m   Constitutional:  Alert and oriented, No acute distress. HEENT:  AT, moist mucus membranes.  Trachea midline, no masses. Cardiovascular: No clubbing, cyanosis, or edema. Respiratory: Normal respiratory effort, no increased work of breathing. GU: Prostate 60+ cc, smooth without nodules Skin: No rashes, bruises or suspicious lesions. Neurologic: Grossly intact, no focal deficits, moving all 4 extremities. Psychiatric: Normal mood and affect.   Assessment & Plan:    1.  T1c prostate cancer (low risk)  Most recent PSA reflects the addition of finasteride  Stable PSA/benign DRE  Desires to continue surveillance  31-month lab visit PSA  1 year follow-up PSA/DRE  2.  BPH with LUTS  Stable voiding symptoms  [name]  Matthew Peaster, MD  [facility] [address], [address] [place], [place] [address] [phone]  

## 2020-08-15 ENCOUNTER — Other Ambulatory Visit: Payer: Self-pay | Admitting: Urology

## 2020-10-15 ENCOUNTER — Other Ambulatory Visit: Payer: Self-pay

## 2020-10-15 ENCOUNTER — Other Ambulatory Visit: Payer: Medicare PPO

## 2020-10-15 ENCOUNTER — Other Ambulatory Visit: Payer: Self-pay | Admitting: Family Medicine

## 2020-10-15 DIAGNOSIS — C61 Malignant neoplasm of prostate: Secondary | ICD-10-CM

## 2020-10-16 LAB — PSA: Prostate Specific Ag, Serum: 7.1 ng/mL — ABNORMAL HIGH (ref 0.0–4.0)

## 2020-10-17 ENCOUNTER — Telehealth: Payer: Self-pay | Admitting: *Deleted

## 2020-10-17 NOTE — Telephone Encounter (Signed)
Notified patient as instructed, patient pleased. Discussed follow-up appointments, patient agrees  

## 2020-10-17 NOTE — Telephone Encounter (Signed)
-----   Message from Abbie Sons, MD sent at 10/17/2020  9:50 AM EDT ----- PSA slightly higher at 7.1.  Will continue to follow and recheck at his November 2022 office visit

## 2020-10-23 IMAGING — MR MR PROSTATE WO/W CM
56 series · 56 of 56 positions shown · IV contrast (10ml Gadavist)
Comparison: None

CLINICAL DATA: Prostate cancer surveillance. Elevated PSA with
history of Gleason 6 disease

EXAM:
MR PROSTATE WITHOUT AND WITH CONTRAST
TECHNIQUE: Multiplanar multisequence MRI images were obtained of the pelvis
centered about the prostate. Pre and post contrast images were
obtained.
CONTRAST:  10mL GADAVIST GADOBUTROL 1 MMOL/ML IV SOLN

[Series 3: ax in&out whole · axial · 6.0mm · 0.74mm/px · 1 of 35 slices shown (1 of 2)]
[im 1/35]
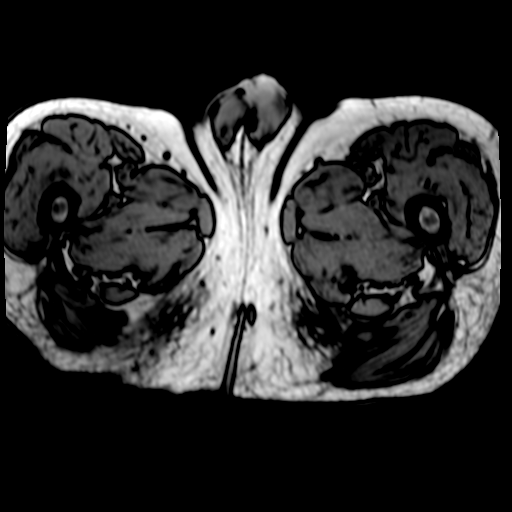

[Series 3: ax in&out whole · axial · 6.0mm · 0.74mm/px · 1 of 35 slices shown (2 of 2)]
[im 1/35]
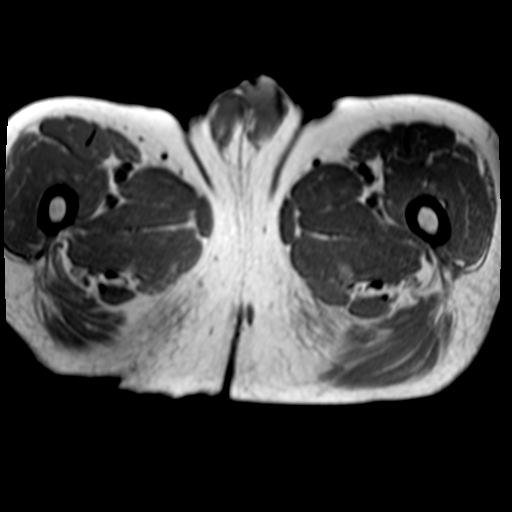

[Series 4: T2 · axial · 3.0mm · 0.56mm/px · 1 of 29 slices shown (1 of 3)]
[im 1/29]
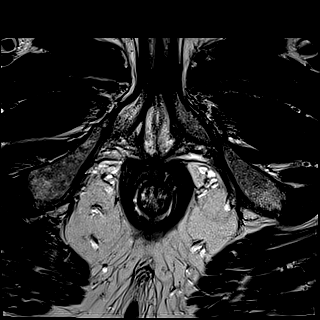

[Series 5: T2 · coronal · 3.0mm · 0.70mm/px · 1 of 35 slices shown (2 of 3)]
[im 1/35]
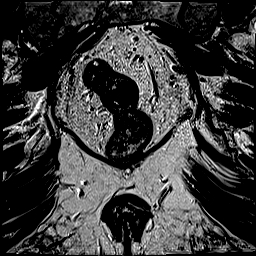

[Series 6: DWI · axial · 3.0mm · 0.86mm/px · 1 of 111 slices shown (1 of 3)]
[im 1/111]
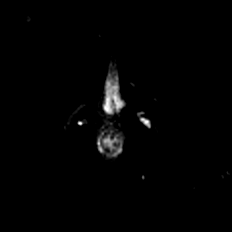

[Series 7: DWI · axial · 3.0mm · 0.86mm/px · 1 of 37 slices shown (2 of 3)]
[im 1/37]
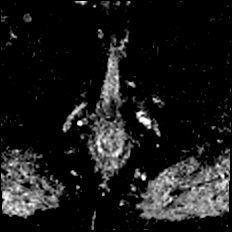

[Series 8: DWI · axial · 3.0mm · 0.86mm/px · 1 of 37 slices shown (3 of 3)]
[im 1/37]
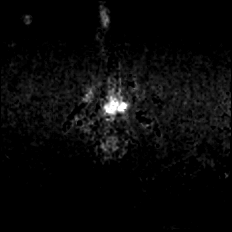

[Series 9: T2 · axial · 1.0mm · 1.04mm/px · 1 of 104 slices shown (3 of 3)]
[im 1/104]
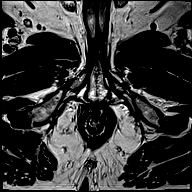

[Series 10: T1 · axial · 3.0mm · 1.15mm/px · 1 of 36 slices shown (1 of 48)]
[im 1/36]
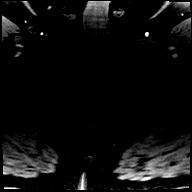

[Series 11: T1 · axial · 3.0mm · 1.15mm/px · 1 of 36 slices shown (2 of 48)]
[im 1/36]
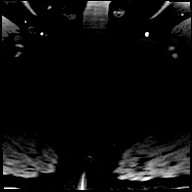

[Series 12: T1 · axial · 3.0mm · 1.15mm/px · 1 of 36 slices shown (3 of 48)]
[im 1/36]
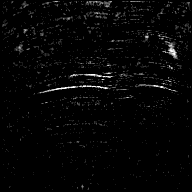

[Series 13: T1 · axial · 3.0mm · 1.15mm/px · 1 of 36 slices shown (4 of 48)]
[im 1/36]
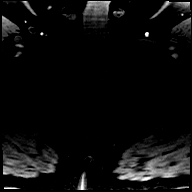

[Series 14: T1 · axial · 3.0mm · 1.15mm/px · 1 of 35 slices shown (5 of 48)]
[im 1/35]
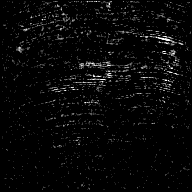

[Series 15: T1 · axial · 3.0mm · 1.15mm/px · 1 of 36 slices shown (6 of 48)]
[im 1/36]
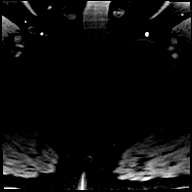

[Series 16: T1 · axial · 3.0mm · 1.15mm/px · 1 of 36 slices shown (7 of 48)]
[im 1/36]
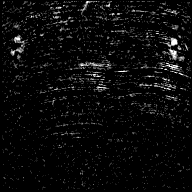

[Series 17: T1 · axial · 3.0mm · 1.15mm/px · 1 of 36 slices shown (8 of 48)]
[im 1/36]
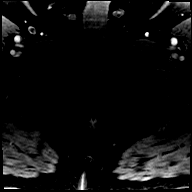

[Series 18: T1 · axial · 3.0mm · 1.15mm/px · 1 of 36 slices shown (9 of 48)]
[im 1/36]
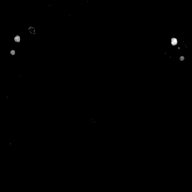

[Series 19: T1 · axial · 3.0mm · 1.15mm/px · 1 of 36 slices shown (10 of 48)]
[im 1/36]
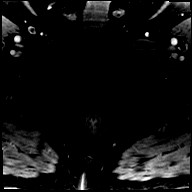

[Series 20: T1 · axial · 3.0mm · 1.15mm/px · 1 of 36 slices shown (11 of 48)]
[im 1/36]
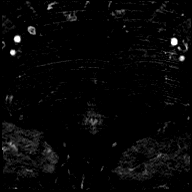

[Series 21: T1 · axial · 3.0mm · 1.15mm/px · 1 of 36 slices shown (12 of 48)]
[im 1/36]
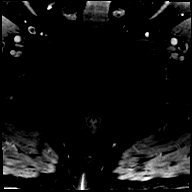

[Series 22: T1 · axial · 3.0mm · 1.15mm/px · 1 of 36 slices shown (13 of 48)]
[im 1/36]
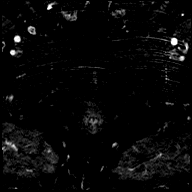

[Series 23: T1 · axial · 3.0mm · 1.15mm/px · 1 of 36 slices shown (14 of 48)]
[im 1/36]
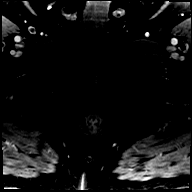

[Series 24: T1 · axial · 3.0mm · 1.15mm/px · 1 of 36 slices shown (15 of 48)]
[im 1/36]
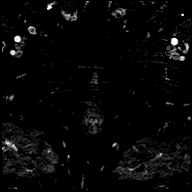

[Series 25: T1 · axial · 3.0mm · 1.15mm/px · 1 of 36 slices shown (16 of 48)]
[im 1/36]
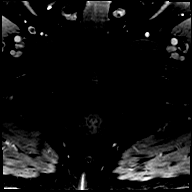

[Series 26: T1 · axial · 3.0mm · 1.15mm/px · 1 of 36 slices shown (17 of 48)]
[im 1/36]
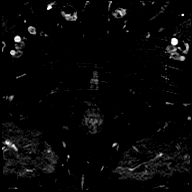

[Series 27: T1 · axial · 3.0mm · 1.15mm/px · 1 of 36 slices shown (18 of 48)]
[im 1/36]
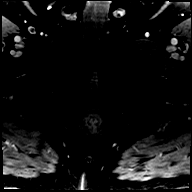

[Series 28: T1 · axial · 3.0mm · 1.15mm/px · 1 of 36 slices shown (19 of 48)]
[im 1/36]
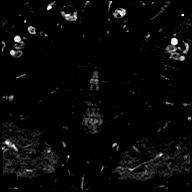

[Series 29: T1 · axial · 3.0mm · 1.15mm/px · 1 of 36 slices shown (20 of 48)]
[im 1/36]
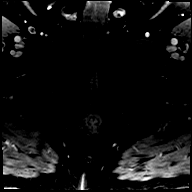

[Series 30: T1 · axial · 3.0mm · 1.15mm/px · 1 of 36 slices shown (21 of 48)]
[im 1/36]
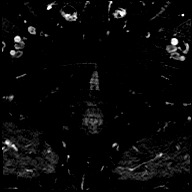

[Series 31: T1 · axial · 3.0mm · 1.15mm/px · 1 of 36 slices shown (22 of 48)]
[im 1/36]
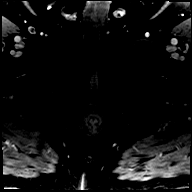

[Series 32: T1 · axial · 3.0mm · 1.15mm/px · 1 of 36 slices shown (23 of 48)]
[im 1/36]
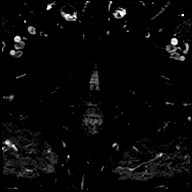

[Series 33: T1 · axial · 3.0mm · 1.15mm/px · 1 of 36 slices shown (24 of 48)]
[im 1/36]
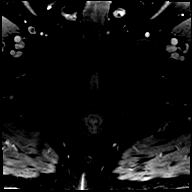

[Series 34: T1 · axial · 3.0mm · 1.15mm/px · 1 of 36 slices shown (25 of 48)]
[im 1/36]
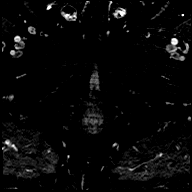

[Series 35: T1 · axial · 3.0mm · 1.15mm/px · 1 of 36 slices shown (26 of 48)]
[im 1/36]
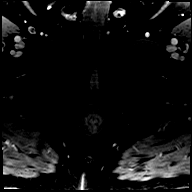

[Series 36: T1 · axial · 3.0mm · 1.15mm/px · 1 of 36 slices shown (27 of 48)]
[im 1/36]
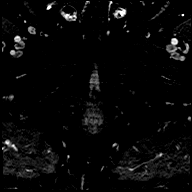

[Series 37: T1 · axial · 3.0mm · 1.15mm/px · 1 of 36 slices shown (28 of 48)]
[im 1/36]
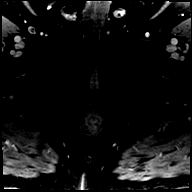

[Series 38: T1 · axial · 3.0mm · 1.15mm/px · 1 of 36 slices shown (29 of 48)]
[im 1/36]
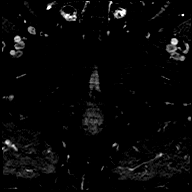

[Series 39: T1 · axial · 3.0mm · 1.15mm/px · 1 of 36 slices shown (30 of 48)]
[im 1/36]
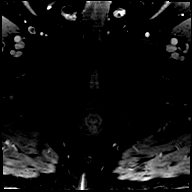

[Series 40: T1 · axial · 3.0mm · 1.15mm/px · 1 of 36 slices shown (31 of 48)]
[im 1/36]
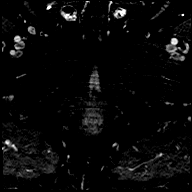

[Series 41: T1 · axial · 3.0mm · 1.15mm/px · 1 of 36 slices shown (32 of 48)]
[im 1/36]
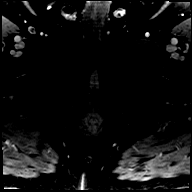

[Series 42: T1 · axial · 3.0mm · 1.15mm/px · 1 of 36 slices shown (33 of 48)]
[im 1/36]
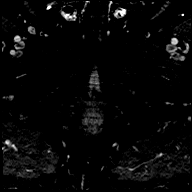

[Series 43: T1 · axial · 3.0mm · 1.15mm/px · 1 of 36 slices shown (34 of 48)]
[im 1/36]
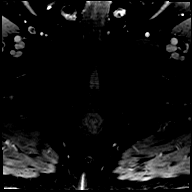

[Series 44: T1 · axial · 3.0mm · 1.15mm/px · 1 of 36 slices shown (35 of 48)]
[im 1/36]
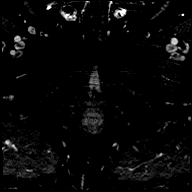

[Series 45: T1 · axial · 3.0mm · 1.15mm/px · 1 of 36 slices shown (36 of 48)]
[im 1/36]
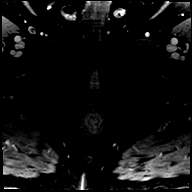

[Series 46: T1 · axial · 3.0mm · 1.15mm/px · 1 of 36 slices shown (37 of 48)]
[im 1/36]
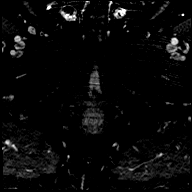

[Series 47: T1 · axial · 3.0mm · 1.15mm/px · 1 of 36 slices shown (38 of 48)]
[im 1/36]
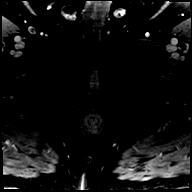

[Series 48: T1 · axial · 3.0mm · 1.15mm/px · 1 of 36 slices shown (39 of 48)]
[im 1/36]
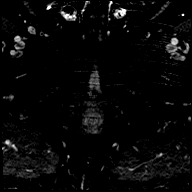

[Series 49: T1 · axial · 3.0mm · 1.15mm/px · 1 of 36 slices shown (40 of 48)]
[im 1/36]
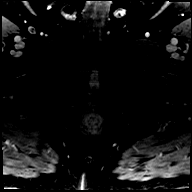

[Series 50: T1 · axial · 3.0mm · 1.15mm/px · 1 of 36 slices shown (41 of 48)]
[im 1/36]
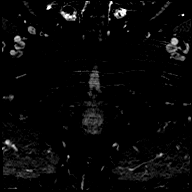

[Series 51: T1 · axial · 3.0mm · 1.15mm/px · 1 of 36 slices shown (42 of 48)]
[im 1/36]
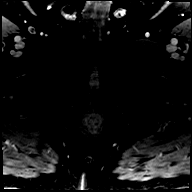

[Series 52: T1 · axial · 3.0mm · 1.15mm/px · 1 of 36 slices shown (43 of 48)]
[im 1/36]
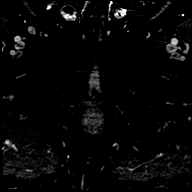

[Series 53: T1 · axial · 3.0mm · 1.15mm/px · 1 of 36 slices shown (44 of 48)]
[im 1/36]
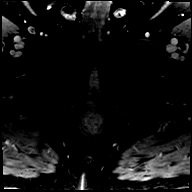

[Series 54: T1 · axial · 3.0mm · 1.15mm/px · 1 of 36 slices shown (45 of 48)]
[im 1/36]
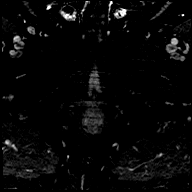

[Series 55: T1 · axial · 3.0mm · 1.15mm/px · 1 of 36 slices shown (46 of 48)]
[im 1/36]
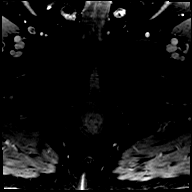

[Series 56: T1 · axial · 3.0mm · 1.15mm/px · 1 of 36 slices shown (47 of 48)]
[im 1/36]
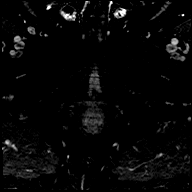

[Series 57: T1 · axial · 3.0mm · 1.15mm/px · 1 of 36 slices shown (48 of 48)]
[im 1/36]
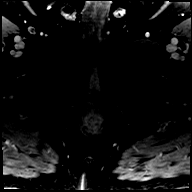

[56 of 56 positions shown; findings below may reference images not displayed]

FINDINGS: Prostate:

PSA:

Transitional zone: Changes of BPH with nodularity, no sign of
high-risk lesion.

Peripheral zone: Generalized atrophy of the peripheral zone with
linear and wedge-shaped areas of T2 hypointensity, also with
baseline T1 hyperintensity of the peripheral zone compatible with
hemorrhage from prior biopsy in the LEFT mid gland. This shows some
heterogeneity on ADC and diffusion. There also small areas near the
prostate apex posteriorly with similar appearance.

Volume: 5.6 x 4.9 x 7.4 (volume = 110 cc) cm

Transcapsular spread:  Absent

Seminal vesicle involvement: Absent but there is evidence of blood
and or proteinaceous material in the LEFT seminal vesicle, perhaps
related to prior biopsy.

Neurovascular bundle involvement: Absent

Pelvic adenopathy: 9 mm lymph node along the LEFT pelvic sidewall
(image 33, series 60.

Bone metastasis: Area in the LEFT proximal femur that shows
enhancement and peripheral signal loss on out of phase gradient echo
imaging. Heterogeneous marrow signal elsewhere without suspicious
findings. Area shows lower signal on fat only imaging that marrow
spaces, more closely approximating surrounding muscle

Other findings: RIGHT inguinal hernia containing fat.
IMPRESSION: LEFT proximal femoral lesion, raising the question of small
metastatic focus or more cellular area of marrow though appears to
show peripheral signal loss rather than diffuse signal loss on out
of phase imaging and more closely approximates muscle on fat only
images. Dedicated musculoskeletal MR assessment or bone scan may be
helpful.

9 mm LEFT pelvic sidewall lymph node, nonspecific. Attention on
follow-up and particularly special attention depending on results of
above evaluations.

Findings of prior prostate biopsy confound assessment in the
prostate in this patient with extensive BPH in changes of presumed
prostatitis. No high-risk lesion on today's exam.

## 2021-03-24 ENCOUNTER — Encounter: Payer: Self-pay | Admitting: Urology

## 2021-04-15 ENCOUNTER — Other Ambulatory Visit: Payer: Self-pay | Admitting: *Deleted

## 2021-04-15 DIAGNOSIS — C61 Malignant neoplasm of prostate: Secondary | ICD-10-CM

## 2021-04-16 ENCOUNTER — Other Ambulatory Visit: Payer: Medicare PPO

## 2021-04-16 ENCOUNTER — Other Ambulatory Visit: Payer: Self-pay

## 2021-04-16 DIAGNOSIS — C61 Malignant neoplasm of prostate: Secondary | ICD-10-CM

## 2021-04-17 ENCOUNTER — Encounter: Payer: Self-pay | Admitting: Urology

## 2021-04-17 ENCOUNTER — Ambulatory Visit: Payer: Medicare PPO | Admitting: Urology

## 2021-04-17 VITALS — BP 176/75 | HR 93 | Ht 70.0 in | Wt 225.0 lb

## 2021-04-17 DIAGNOSIS — C61 Malignant neoplasm of prostate: Secondary | ICD-10-CM

## 2021-04-17 DIAGNOSIS — R972 Elevated prostate specific antigen [PSA]: Secondary | ICD-10-CM | POA: Diagnosis not present

## 2021-04-17 LAB — PSA: Prostate Specific Ag, Serum: 5.5 ng/mL — ABNORMAL HIGH (ref 0.0–4.0)

## 2021-04-17 MED ORDER — FINASTERIDE 5 MG PO TABS: 5.0000 mg | ORAL_TABLET | Freq: Every day | ORAL | 3 refills | Status: DC

## 2021-04-17 NOTE — Progress Notes (Signed)
04/17/2021 11:25 AM   [name] [birth year] [id number]  Referring provider: Kirk Ruths, MD [facility] [place],  [place] [address]  Chief Complaint  Patient presents with   Prostate Cancer    Urologic history: 1.  T1c low risk prostate cancer -Biopsy 11/2018 PSA 18.17; prostate volume 130 g -4/12 cores positive Gleason 3+3 adenocarcinoma -left mid (50%), left apex (3%), left base (40%) and left lateral mid (10%) -After discussing management options elected active surveillance -Started finasteride August 2020 -Prostate MRI 09/26/2019; 110 cc volume; no suspicious lesions for high-grade cancer; area left proximal femur with enhancement; MRI femur felt to represent an incidental chondroid lesion, no findings suspicious for metastatic disease    HPI: 65 y.o. male presents for annual follow-up.  Doing well since last visit No bothersome LUTS Denies dysuria, gross hematuria Denies flank, abdominal or pelvic pain Remains on finasteride Slight bump in his interim 6 month PSA at 7.1 PSA 04/16/2021 5.5 (uncorrected)     PMH: Past Medical History:  Diagnosis Date   Hypertension    Stroke [facility])     Surgical History: Past Surgical History:  Procedure Laterality Date   none      Home Medications:  Allergies as of 04/17/2021   No Known Allergies      Medication List        Accurate as of April 17, 2021 11:25 AM. If you have any questions, ask your nurse or doctor.          STOP taking these medications    losartan 100 MG tablet Commonly known as: COZAAR Stopped by: Abbie Sons, MD       TAKE these medications    amLODipine 10 MG tablet Commonly known as: NORVASC Take 10 mg by mouth daily.   aspirin 81 MG EC tablet   atorvastatin 40 MG tablet Commonly known as: LIPITOR Take 40 mg by mouth daily.   carvedilol 6.25 MG tablet Commonly known as: COREG Take 3.125 mg by mouth 2 (two) times daily with  a meal.   finasteride 5 MG tablet Commonly known as: PROSCAR TAKE 1 TABLET BY MOUTH EVERY DAY   gabapentin 100 MG capsule Commonly known as: NEURONTIN Take 100 mg by mouth 2 (two) times daily.   hydrochlorothiazide 12.5 MG tablet Commonly known as: HYDRODIURIL Take 12.5 mg by mouth daily.   losartan-hydrochlorothiazide 100-12.5 MG tablet Commonly known as: HYZAAR Take 1 tablet by mouth daily.        Allergies: No Known Allergies  Family History: Family History  Problem Relation Age of Onset   Hypertension Mother    Hypertension Father     Social History:  reports that he has never smoked. He has never used smokeless tobacco. He reports that he does not drink alcohol and does not use drugs.   Physical Exam: BP (!) 176/75   Pulse 93   Ht 5\' 10"  (1.778 m)   Wt 225 lb (102.1 kg)   BMI 32.28 kg/m   Constitutional:  Alert and oriented, No acute distress. HEENT: [place] AT, moist mucus membranes.  Trachea midline, no masses. Cardiovascular: No clubbing, cyanosis, or edema. Respiratory: Normal respiratory effort, no increased work of breathing. GU: Prostate 60+ cc, smooth without nodules   Assessment & Plan:    1.  Low risk prostate cancer Benign DRE PSA stable Lab visit 6 months for PSA 1 year office visit PSA/DRE  2.  BPH with LUTS Stable Finasteride refilled  Abbie Sons, [place] [address], [place], [place] [address] [phone]

## 2021-06-18 ENCOUNTER — Encounter: Payer: Self-pay | Admitting: Gastroenterology

## 2021-06-18 NOTE — H&P (Signed)
Pre-Procedure H&P   Patient ID: [name] is a 66 y.o. male.  Gastroenterology Provider: Annamaria Helling, DO  PCP: Kirk Ruths, MD  Date: 06/19/2021  HPI [name] is a 66 y.o. male who presents today for Colonoscopy for Hemoccult positive stool.  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  Patient has noted normal bowel movements without blood or melena.  Most recent lab data demonstrates iron saturation 23% ferritin 186 hemoglobin 13.2 MCV 92 platelets 223,000 creatinine 1.3 A1c 7.6%.  No other acute GI complaints  No family history of colorectal cancer or polyps.  Past Medical History:  Diagnosis Date   Hypertension    Stroke [facility])     Past Surgical History:  Procedure Laterality Date   none      Family History No h/o GI disease or malignancy  Review of Systems  Constitutional:  Negative for activity change, appetite change, chills, diaphoresis, fatigue, fever and unexpected weight change.  HENT:  Negative for trouble swallowing and voice change.   Respiratory:  Negative for shortness of breath and wheezing.   Cardiovascular:  Negative for chest pain, palpitations and leg swelling.  Gastrointestinal:  Negative for abdominal distention, abdominal pain, anal bleeding, blood in stool, constipation, diarrhea, nausea and vomiting.  Musculoskeletal:  Negative for arthralgias and myalgias.  Skin:  Negative for color change and pallor.  Neurological:  Negative for dizziness, syncope and weakness.  Psychiatric/Behavioral:  Negative for confusion. The patient is not nervous/anxious.   All other systems reviewed and are negative.   Medications No current facility-administered medications on file prior to encounter.   Current Outpatient Medications on File Prior to Encounter  Medication Sig  Dispense Refill   amLODipine (NORVASC) 10 MG tablet Take 10 mg by mouth daily.     carvedilol (COREG) 6.25 MG tablet Take 3.125 mg by mouth 2 (two) times daily with a meal.      aspirin 81 MG EC tablet      atorvastatin (LIPITOR) 40 MG tablet Take 40 mg by mouth daily.     gabapentin (NEURONTIN) 100 MG capsule Take 100 mg by mouth 2 (two) times daily.      hydrochlorothiazide (HYDRODIURIL) 12.5 MG tablet Take 12.5 mg by mouth daily.      Pertinent medications related to GI and procedure were reviewed by me with the patient prior to the procedure   Current Facility-Administered Medications:    0.9 %  sodium chloride infusion, , Intravenous, Continuous, Annamaria Helling, DO, Last Rate: 20 mL/hr at 06/19/21 0714, New Bag at 06/19/21 0714      No Known Allergies Allergies were reviewed by me prior to the procedure  Objective    Vitals:   06/19/21 0700  BP: (!) 143/83  Pulse: 80  Resp: 20  Temp: (!) 97.2 F (36.2 C)  TempSrc: Temporal  SpO2: 98%  Weight: 98.4 kg  Height: 5\' 10"  (1.778 m)     Physical Exam Vitals and nursing note reviewed.  Constitutional:      General: He is not in acute distress.    Appearance: Normal appearance. He is obese. He is not ill-appearing, toxic-appearing or diaphoretic.  HENT:     Head: Normocephalic and atraumatic.     Nose: Nose normal.     Mouth/Throat:     Mouth: Mucous membranes are  moist.     Pharynx: Oropharynx is clear.  Eyes:     General: No scleral icterus.    Extraocular Movements: Extraocular movements intact.  Cardiovascular:     Rate and Rhythm: Normal rate and regular rhythm.     Heart sounds: Normal heart sounds. No murmur heard.   No friction rub. No gallop.  Pulmonary:     Effort: Pulmonary effort is normal. No respiratory distress.     Breath sounds: Normal breath sounds. No wheezing, rhonchi or rales.  Abdominal:     General: Bowel sounds are normal. There is no distension.     Palpations: Abdomen is soft.      Tenderness: There is no abdominal tenderness. There is no guarding or rebound.  Musculoskeletal:     Cervical back: Neck supple.     Right lower leg: No edema.     Left lower leg: No edema.  Skin:    General: Skin is warm and dry.     Coloration: Skin is not jaundiced or pale.  Neurological:     Mental Status: He is alert and oriented to person, place, and time. Mental status is at baseline.     Comments: Residual right-sided UE deficits from previous CVA  Psychiatric:        Mood and Affect: Mood normal.        Behavior: Behavior normal.        Thought Content: Thought content normal.        Judgment: Judgment normal.     Assessment:  [name] is a 66 y.o. male  who presents today for Colonoscopy for Hemoccult positive stool.  Plan:  Colonoscopy with possible intervention today  Colonoscopy with possible biopsy, control of bleeding, polypectomy, and interventions as necessary has been discussed with the patient/patient representative. Informed consent was obtained from the patient/patient representative after explaining the indication, nature, and risks of the procedure including but not limited to death, bleeding, perforation, missed neoplasm/lesions, cardiorespiratory compromise, and reaction to medications. Opportunity for questions was given and appropriate answers were provided. Patient/patient representative has verbalized understanding is amenable to undergoing the procedure.   Annamaria Helling, DO  [facility] Gastroenterology  Portions of the record may have been created with voice recognition software. Occasional wrong-word or 'sound-a-like' substitutions may have occurred due to the inherent limitations of voice recognition software.  Read the chart carefully and recognize, using context, where substitutions may have occurred.

## 2021-06-19 ENCOUNTER — Ambulatory Visit: Payer: Medicare PPO | Admitting: Certified Registered Nurse Anesthetist

## 2021-06-19 ENCOUNTER — Ambulatory Visit
Admission: RE | Admit: 2021-06-19 | Discharge: 2021-06-19 | Disposition: A | Discharge status: Home / Self Care | Payer: Medicare PPO | Attending: Gastroenterology | Admitting: Gastroenterology

## 2021-06-19 ENCOUNTER — Encounter: Payer: Self-pay | Admitting: Gastroenterology

## 2021-06-19 ENCOUNTER — Encounter
Admission: RE | Disposition: A | Discharge status: Home / Self Care | Payer: Self-pay | Source: Home / Self Care | Attending: Gastroenterology

## 2021-06-19 ENCOUNTER — Other Ambulatory Visit: Payer: Self-pay

## 2021-06-19 DIAGNOSIS — I69351 Hemiplegia and hemiparesis following cerebral infarction affecting right dominant side: Secondary | ICD-10-CM | POA: Diagnosis not present

## 2021-06-19 DIAGNOSIS — K529 Noninfective gastroenteritis and colitis, unspecified: Secondary | ICD-10-CM | POA: Insufficient documentation

## 2021-06-19 DIAGNOSIS — Z8546 Personal history of malignant neoplasm of prostate: Secondary | ICD-10-CM | POA: Insufficient documentation

## 2021-06-19 DIAGNOSIS — R195 Other fecal abnormalities: Secondary | ICD-10-CM | POA: Diagnosis present

## 2021-06-19 DIAGNOSIS — K640 First degree hemorrhoids: Secondary | ICD-10-CM | POA: Insufficient documentation

## 2021-06-19 DIAGNOSIS — D120 Benign neoplasm of cecum: Secondary | ICD-10-CM | POA: Insufficient documentation

## 2021-06-19 DIAGNOSIS — I10 Essential (primary) hypertension: Secondary | ICD-10-CM | POA: Insufficient documentation

## 2021-06-19 DIAGNOSIS — Z7982 Long term (current) use of aspirin: Secondary | ICD-10-CM | POA: Diagnosis not present

## 2021-06-19 HISTORY — PX: COLONOSCOPY: SHX5424

## 2021-06-19 SURGERY — COLONOSCOPY
Anesthesia: General

## 2021-06-19 MED ORDER — PROPOFOL 500 MG/50ML IV EMUL
INTRAVENOUS | Status: AC
  Filled 2021-06-19: qty 50

## 2021-06-19 MED ORDER — SODIUM CHLORIDE 0.9 % IV SOLN: INTRAVENOUS | Status: DC

## 2021-06-19 MED ORDER — LIDOCAINE HCL (PF) 2 % IJ SOLN
INTRAMUSCULAR | Status: AC
  Filled 2021-06-19: qty 5

## 2021-06-19 MED ORDER — PROPOFOL 500 MG/50ML IV EMUL
INTRAVENOUS | Status: DC | PRN
  Administered 2021-06-19: 150 ug/kg/min via INTRAVENOUS

## 2021-06-19 MED ORDER — LIDOCAINE HCL (CARDIAC) PF 100 MG/5ML IV SOSY
PREFILLED_SYRINGE | INTRAVENOUS | Status: DC | PRN
  Administered 2021-06-19: 50 mg via INTRAVENOUS

## 2021-06-19 MED ORDER — PROPOFOL 10 MG/ML IV BOLUS
INTRAVENOUS | Status: DC | PRN
Start: 2021-06-19 — End: 2021-06-19
  Administered 2021-06-19: 60 mg via INTRAVENOUS

## 2021-06-19 NOTE — Op Note (Signed)
[facility] Gastroenterology Patient Name: [name] Procedure Date: 06/19/2021 6:58 AM MRN: [id number] Account #: [id number] Date of Birth: 10/14/1955 Admit Type: Outpatient Age: 66 Room: [facility] ENDO ROOM 1 Gender: Male Note Status: Finalized Instrument Name: Colonoscope [id number] Procedure:             Colonoscopy Indications:           Evaluation of unexplained GI bleeding presenting with                         fecal occult blood Providers:             Rueben Bash, DO Referring MD:          Ocie Cornfield. Ouida Sills MD, MD (Referring MD) Medicines:             Monitored Anesthesia Care Complications:         No immediate complications. Estimated blood loss:                         Minimal. Procedure:             Pre-Anesthesia Assessment:                        - Prior to the procedure, a History and Physical was                         performed, and patient medications and allergies were                         reviewed. The patient is competent. The risks and                         benefits of the procedure and the sedation options and                         risks were discussed with the patient. All questions                         were answered and informed consent was obtained.                         Patient identification and proposed procedure were                         verified by the physician, the nurse, the anesthetist                         and the technician in the endoscopy suite. Mental                         Status Examination: alert and oriented. Airway                         Examination: normal oropharyngeal airway and neck                         mobility. Respiratory Examination: clear to  auscultation. CV Examination: RRR, no murmurs, no S3                         or S4. Prophylactic Antibiotics: The patient does not                         require prophylactic antibiotics. Prior                          Anticoagulants: The patient has taken no previous                         anticoagulant or antiplatelet agents except for                         aspirin. ASA Grade Assessment: III - A patient with                         severe systemic disease. After reviewing the risks and                         benefits, the patient was deemed in satisfactory                         condition to undergo the procedure. The anesthesia                         plan was to use monitored anesthesia care (MAC).                         Immediately prior to administration of medications,                         the patient was re-assessed for adequacy to receive                         sedatives. The heart rate, respiratory rate, oxygen                         saturations, blood pressure, adequacy of pulmonary                         ventilation, and response to care were monitored                         throughout the procedure. The physical status of the                         patient was re-assessed after the procedure.                        After obtaining informed consent, the colonoscope was                         passed under direct vision. Throughout the procedure,                         the patient's blood pressure, pulse, and oxygen  saturations were monitored continuously. The                         Colonoscope was introduced through the anus and                         advanced to the the cecum, identified by appendiceal                         orifice and ileocecal valve. The colonoscopy was                         performed without difficulty. The patient tolerated                         the procedure well. The quality of the bowel                         preparation was evaluated using the BBPS [facility] Bowel                         Preparation Scale) with scores of: Right Colon = 3,                         Transverse Colon = 3 and Left Colon = 3 (entire mucosa                          seen well with no residual staining, small fragments                         of stool or opaque liquid). The total BBPS score                         equals 9. The ileocecal valve, appendiceal orifice,                         and rectum were photographed. Findings:      The perianal and digital rectal examinations were normal. Pertinent       negatives include normal sphincter tone.      A 3 to 4 mm polyp was found in the cecum. The polyp was sessile. The       polyp was removed with a cold snare. Resection and retrieval were       complete. Estimated blood loss was minimal.      Localized mild inflammation characterized by erythema was found in the       descending colon. Biopsies were taken with a cold forceps for histology.       Estimated blood loss was minimal.      Non-bleeding internal hemorrhoids were found during retroflexion. The       hemorrhoids were Grade I (internal hemorrhoids that do not prolapse).       Estimated blood loss: none.      The exam was otherwise without abnormality on direct and retroflexion       views. Impression:            - One 3 to 4 mm polyp in the cecum, removed with a  cold snare. Resected and retrieved.                        - Localized mild inflammation was found in the                         descending colon. Biopsied.                        - Non-bleeding internal hemorrhoids.                        - The examination was otherwise normal on direct and                         retroflexion views. Recommendation:        - Discharge patient to home.                        - Resume previous diet.                        - Continue present medications.                        - Await pathology results.                        - Repeat colonoscopy for surveillance based on                         pathology results.                        - Return to referring physician as previously                          scheduled.                        - No ibuprofen, naproxen, or other non-steroidal                         anti-inflammatory drugs for 5 days after polyp                         removal. Ok for baby aspirin Procedure Code(s):     --- Professional ---                        [phone], Colonoscopy, flexible; with biopsy, single or                         multiple Diagnosis Code(s):     --- Professional ---                        K63.5, Polyp of colon                        K52.9, Noninfective gastroenteritis and colitis,                         unspecified  K64.0, First degree hemorrhoids                        R19.5, Other fecal abnormalities CPT copyright 2019 American Medical Association. All rights reserved. The codes documented in this report are preliminary and upon coder review may  be revised to meet current compliance requirements. Attending Participation:      I personally performed the entire procedure. Volney American, DO Annamaria Helling DO, DO 06/19/2021 8:09:06 AM This report has been signed electronically. Number of Addenda: 0 Note Initiated On: 06/19/2021 6:58 AM Scope Withdrawal Time: 0 hours 17 minutes 40 seconds  Total Procedure Duration: 0 hours 24 minutes 10 seconds  Estimated Blood Loss:  Estimated blood loss was minimal.      [facility]

## 2021-06-19 NOTE — Anesthesia Preprocedure Evaluation (Signed)
Anesthesia Evaluation  Patient identified by MRN, date of birth, ID band Patient awake    Reviewed: Allergy & Precautions, NPO status , Patient's Chart, lab work & pertinent test results  History of Anesthesia Complications Negative for: history of anesthetic complications  Airway Mallampati: III  TM Distance: <3 FB Neck ROM: full    Dental  (+) Chipped   Pulmonary neg pulmonary ROS, neg shortness of breath,    Pulmonary exam normal        Cardiovascular Exercise Tolerance: Good hypertension, (-) anginaNormal cardiovascular exam     Neuro/Psych CVA negative psych ROS   GI/Hepatic negative GI ROS, Neg liver ROS, neg GERD  ,  Endo/Other  diabetes, Type 2  Renal/GU Renal disease  negative genitourinary   Musculoskeletal   Abdominal   Peds  Hematology negative hematology ROS (+)   Anesthesia Other Findings Past Medical History: No date: Hypertension No date: Stroke [facility])  Past Surgical History: No date: none  BMI    Body Mass Index: 31.14 kg/m      Reproductive/Obstetrics negative OB ROS                             Anesthesia Physical Anesthesia Plan  ASA: 3  Anesthesia Plan: General   Post-op Pain Management:    Induction: Intravenous  PONV Risk Score and Plan: Propofol infusion and TIVA  Airway Management Planned: Natural Airway and Nasal Cannula  Additional Equipment:   Intra-op Plan:   Post-operative Plan:   Informed Consent: I have reviewed the patients History and Physical, chart, labs and discussed the procedure including the risks, benefits and alternatives for the proposed anesthesia with the patient or authorized representative who has indicated his/her understanding and acceptance.     Dental Advisory Given  Plan Discussed with: Anesthesiologist, CRNA and Surgeon  Anesthesia Plan Comments: (Patient consented for risks of anesthesia including but not  limited to:  - adverse reactions to medications - risk of airway placement if required - damage to eyes, teeth, lips or other oral mucosa - nerve damage due to positioning  - sore throat or hoarseness - Damage to heart, brain, nerves, lungs, other parts of body or loss of life  Patient voiced understanding.)        Anesthesia Quick Evaluation

## 2021-06-19 NOTE — Transfer of Care (Signed)
Immediate Anesthesia Transfer of Care Note  Patient: Demarus Latterell  Procedure(s) Performed: COLONOSCOPY  Patient Location: Endoscopy Unit  Anesthesia Type:General  Level of Consciousness: drowsy  Airway & Oxygen Therapy: Patient Spontanous Breathing  Post-op Assessment: Report given to RN and Post -op Vital signs reviewed and stable  Post vital signs: Reviewed and stable  Last Vitals:  Vitals Value Taken Time  BP 102/66 06/19/21 0807  Temp 35.8 C 06/19/21 0807  Pulse 66 06/19/21 0807  Resp 16 06/19/21 0807  SpO2 98 % 06/19/21 0807  Vitals shown include unvalidated device data.  Last Pain:  Vitals:   06/19/21 0807  TempSrc: Temporal  PainSc:          Complications: No notable events documented.

## 2021-06-19 NOTE — Interval H&P Note (Signed)
History and Physical Interval Note: Preprocedure H&P from 06/19/21  was reviewed and there was no interval change after seeing and examining the patient.  Written consent was obtained from the patient after discussion of risks, benefits, and alternatives. Patient has consented to proceed with Colonoscopy with possible intervention   06/19/2021 7:31 AM  [name]  has presented today for surgery, with the diagnosis of Colon cancer screening (Z12.11) Positive fecal occult blood test (R19.5).  The various methods of treatment have been discussed with the patient and family. After consideration of risks, benefits and other options for treatment, the patient has consented to  Procedure(s) with comments: COLONOSCOPY (N/A) - DM as a surgical intervention.  The patient's history has been reviewed, patient examined, no change in status, stable for surgery.  I have reviewed the patient's chart and labs.  Questions were answered to the patient's satisfaction.     [name]

## 2021-06-19 NOTE — Anesthesia Postprocedure Evaluation (Signed)
Anesthesia Post Note  Patient: [name]  Procedure(s) Performed: COLONOSCOPY  Patient location during evaluation: Endoscopy Anesthesia Type: General Level of consciousness: awake and alert Pain management: pain level controlled Vital Signs Assessment: post-procedure vital signs reviewed and stable Respiratory status: spontaneous breathing, nonlabored ventilation, respiratory function stable and patient connected to nasal cannula oxygen Cardiovascular status: blood pressure returned to baseline and stable Postop Assessment: no apparent nausea or vomiting Anesthetic complications: no   No notable events documented.   Last Vitals:  Vitals:   06/19/21 0827 06/19/21 0837  BP: 122/75 128/74  Pulse: 73 71  Resp: (!) 24 19  Temp:    SpO2: 98% 99%    Last Pain:  Vitals:   06/19/21 0837  TempSrc:   PainSc: 0-No pain                 Precious Haws Arick Mareno

## 2021-06-22 ENCOUNTER — Encounter: Payer: Self-pay | Admitting: Gastroenterology

## 2021-06-22 LAB — SURGICAL PATHOLOGY

## 2021-10-15 ENCOUNTER — Other Ambulatory Visit: Payer: Medicare PPO

## 2021-10-15 DIAGNOSIS — R972 Elevated prostate specific antigen [PSA]: Secondary | ICD-10-CM

## 2021-10-16 ENCOUNTER — Encounter: Payer: Self-pay | Admitting: Urology

## 2021-10-16 LAB — PSA: Prostate Specific Ag, Serum: 6.3 ng/mL — ABNORMAL HIGH (ref 0.0–4.0)

## 2022-04-06 ENCOUNTER — Other Ambulatory Visit: Payer: Self-pay | Admitting: *Deleted

## 2022-04-06 DIAGNOSIS — C61 Malignant neoplasm of prostate: Secondary | ICD-10-CM

## 2022-04-19 ENCOUNTER — Other Ambulatory Visit: Payer: Medicare PPO

## 2022-04-19 DIAGNOSIS — C61 Malignant neoplasm of prostate: Secondary | ICD-10-CM

## 2022-04-20 LAB — PSA: Prostate Specific Ag, Serum: 4.1 ng/mL — ABNORMAL HIGH (ref 0.0–4.0)

## 2022-04-21 ENCOUNTER — Encounter: Payer: Self-pay | Admitting: Urology

## 2022-04-21 ENCOUNTER — Ambulatory Visit: Payer: Medicare PPO | Admitting: Urology

## 2022-04-21 VITALS — BP 156/79 | HR 81 | Ht 70.0 in | Wt 219.0 lb

## 2022-04-21 DIAGNOSIS — C61 Malignant neoplasm of prostate: Secondary | ICD-10-CM

## 2022-04-21 DIAGNOSIS — N401 Enlarged prostate with lower urinary tract symptoms: Secondary | ICD-10-CM | POA: Diagnosis not present

## 2022-04-21 MED ORDER — FINASTERIDE 5 MG PO TABS: 5.0000 mg | ORAL_TABLET | Freq: Every day | ORAL | 3 refills | Status: DC

## 2022-04-21 NOTE — Patient Instructions (Signed)

## 2022-04-21 NOTE — Progress Notes (Signed)
04/21/2022 11:18 AM   [name] 10-Dec-1955 [id number]  Referring provider: Kirk Ruths, MD [name] [place],  [place] [address]  Chief Complaint  Patient presents with   Prostate Cancer    Urologic history: 1.  T1c low risk prostate cancer -Biopsy 11/2018 PSA 18.17; prostate volume 130 g -4/12 cores positive Gleason 3+3 adenocarcinoma -left mid (50%), left apex (3%), left base (40%) and left lateral mid (10%) -After discussing management options elected active surveillance -Started finasteride August 2020 -Prostate MRI 09/26/2019; 110 cc volume; no suspicious lesions for high-grade cancer; area left proximal femur with enhancement; MRI femur felt to represent an incidental chondroid lesion, no findings suspicious for metastatic disease    HPI: 66 y.o. male presents for annual follow-up.  Doing well since last visit No bothersome LUTS Denies dysuria, gross hematuria Denies flank, abdominal or pelvic pain Remains on finasteride PSA 04/19/2021 4.1 (uncorrected)     PMH: Past Medical History:  Diagnosis Date   Hypertension    Stroke [facility])     Surgical History: Past Surgical History:  Procedure Laterality Date   COLONOSCOPY N/A 06/19/2021   Procedure: COLONOSCOPY;  Surgeon: Annamaria Helling, DO;  Location: [facility] ENDOSCOPY;  Service: Gastroenterology;  Laterality: N/A;  DM   none      Home Medications:  Allergies as of 04/21/2022   No Known Allergies      Medication List        Accurate as of April 21, 2022 11:18 AM. If you have any questions, ask your nurse or doctor.          amLODipine 10 MG tablet Commonly known as: NORVASC Take 10 mg by mouth daily.   aspirin EC 81 MG tablet   atorvastatin 40 MG tablet Commonly known as: LIPITOR Take 40 mg by mouth daily.   carvedilol 6.25 MG tablet Commonly known as: COREG Take 3.125 mg by mouth 2 (two) times daily with a meal.   finasteride 5 MG  tablet Commonly known as: PROSCAR Take 1 tablet (5 mg total) by mouth daily.   gabapentin 100 MG capsule Commonly known as: NEURONTIN Take 100 mg by mouth 2 (two) times daily.   hydrochlorothiazide 12.5 MG tablet Commonly known as: HYDRODIURIL Take 12.5 mg by mouth daily.        Allergies: No Known Allergies  Family History: Family History  Problem Relation Age of Onset   Hypertension Mother    Hypertension Father     Social History:  reports that he has never smoked. He has never used smokeless tobacco. He reports that he does not drink alcohol and does not use drugs.   Physical Exam: BP (!) 156/79   Pulse 81   Ht '5\' 10"'$  (1.778 m)   Wt 219 lb (99.3 kg)   BMI 31.42 kg/m   Constitutional:  Alert and oriented, No acute distress. HEENT: [place] AT, moist mucus membranes.  Trachea midline, no masses. Cardiovascular: No clubbing, cyanosis, or edema. Respiratory: Normal respiratory effort, no increased work of breathing. GU: Prostate 60+ cc, smooth without nodules   Assessment & Plan:    1.  Low risk prostate cancer Benign DRE PSA stable He asked today if I thought he was making the right decision regarding active surveillance.  Based on his pathology and PSA stability he was informed I thought this was an excellent option.  However he never had a confirmatory biopsy and we discussed this is the recommendation for patients  on active surveillance and he has elected to schedule.  I offered to schedule biopsy in same-day surgery under sedation however he elected office scheduling Lab visit 6 months for PSA 1 year office visit PSA/DRE  2.  BPH with LUTS Stable Finasteride refilled   Abbie Sons, MD  [facility] [address] [place], [place] [address] [phone]

## 2022-04-28 ENCOUNTER — Encounter: Payer: Self-pay | Admitting: Urology

## 2022-04-30 ENCOUNTER — Telehealth: Payer: Self-pay

## 2022-04-30 NOTE — Telephone Encounter (Signed)
Pt confrmed that his insurance will cover his bx. However, at this time pt wants ti cx. Appt cx.

## 2022-04-30 NOTE — Telephone Encounter (Signed)
This patient called in wanting to discuss information he discovered about his insurance. CB# [phone].

## 2022-04-30 NOTE — Telephone Encounter (Signed)
Pt calling stating that he would like to cancel prostate biopsy

## 2022-05-10 ENCOUNTER — Other Ambulatory Visit: Payer: Medicare PPO | Admitting: Urology

## 2022-10-21 ENCOUNTER — Other Ambulatory Visit: Payer: Medicare PPO

## 2022-10-21 DIAGNOSIS — C61 Malignant neoplasm of prostate: Secondary | ICD-10-CM

## 2022-10-22 LAB — PSA: Prostate Specific Ag, Serum: 4.5 ng/mL — ABNORMAL HIGH (ref 0.0–4.0)

## 2023-03-05 ENCOUNTER — Other Ambulatory Visit: Payer: Self-pay | Admitting: Urology

## 2023-04-21 ENCOUNTER — Other Ambulatory Visit: Payer: Self-pay | Admitting: *Deleted

## 2023-04-21 DIAGNOSIS — C61 Malignant neoplasm of prostate: Secondary | ICD-10-CM

## 2023-04-21 DIAGNOSIS — N401 Enlarged prostate with lower urinary tract symptoms: Secondary | ICD-10-CM

## 2023-04-22 ENCOUNTER — Other Ambulatory Visit: Payer: Medicare PPO

## 2023-04-22 DIAGNOSIS — C61 Malignant neoplasm of prostate: Secondary | ICD-10-CM

## 2023-04-22 DIAGNOSIS — N401 Enlarged prostate with lower urinary tract symptoms: Secondary | ICD-10-CM

## 2023-04-23 LAB — PSA: Prostate Specific Ag, Serum: 4.4 ng/mL — ABNORMAL HIGH (ref 0.0–4.0)

## 2023-04-27 ENCOUNTER — Encounter: Payer: Self-pay | Admitting: Urology

## 2023-04-27 ENCOUNTER — Ambulatory Visit: Payer: Medicare PPO | Admitting: Urology

## 2023-04-27 VITALS — BP 169/82 | HR 74 | Ht 70.0 in | Wt 216.0 lb

## 2023-04-27 DIAGNOSIS — C61 Malignant neoplasm of prostate: Secondary | ICD-10-CM

## 2023-04-27 DIAGNOSIS — N401 Enlarged prostate with lower urinary tract symptoms: Secondary | ICD-10-CM | POA: Diagnosis not present

## 2023-04-27 DIAGNOSIS — R3914 Feeling of incomplete bladder emptying: Secondary | ICD-10-CM

## 2023-04-27 NOTE — Progress Notes (Signed)
I, Maysun Anabel Bene, acting as a scribe for Riki Altes, MD., have documented all relevant documentation on the behalf of Riki Altes, MD, as directed by Riki Altes, MD while in the presence of Riki Altes, MD.  04/27/2023 10:19 AM   [name] 1955-07-20 [id number]  Referring provider: Lauro Regulus, MD [address] [facility] [place],  [place] [address]  Urologic history: 1.  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 male presents for annual follow-up.  Doing well since last visit No bothersome LUTS Denies dysuria, gross hematuria Denies flank, abdominal or pelvic pain Remains on finasteride PSA 10/21/22 4.5; 04/22/23 4.4 He was scheduled for a confirmatory biopsy in December 2023, however canceled the appointment and elected not to pursue confirmatory biopsy at this time.  PSA trend   Prostate Specific Ag, Serum  Latest Ref Rng 0.0 - 4.0 ng/mL  08/11/2018 14.1 (H)   04/12/2019 10.1 (H)   04/14/2020 5.9 (H)   10/15/2020 7.1 (H)   04/16/2021 5.5 (H)   10/15/2021 6.3 (H)   04/19/2022 4.1 (H)   10/21/2022 4.5 (H)   04/22/2023 4.4 (H)     PMH: Past Medical History:  Diagnosis Date   Hypertension    Stroke [facility])     Surgical History: Past Surgical History:  Procedure Laterality Date   COLONOSCOPY N/A 06/19/2021   Procedure: COLONOSCOPY;  Surgeon: Jaynie Collins, DO;  Location: [facility] ENDOSCOPY;  Service:  Gastroenterology;  Laterality: N/A;  DM   none      Home Medications:  Allergies as of 04/27/2023   No Known Allergies      Medication List        Accurate as of April 27, 2023 10:19 AM. If you have any questions, ask your nurse or doctor.          amLODipine 10 MG tablet Commonly known as: NORVASC Take 10 mg by mouth daily.   aspirin EC 81 MG tablet   atorvastatin 40 MG tablet Commonly known as: LIPITOR Take 40 mg by mouth daily.   carvedilol 6.25 MG tablet Commonly known as: COREG Take 3.125 mg by mouth 2 (two) times daily with a meal.   finasteride 5 MG tablet Commonly known as: PROSCAR TAKE 1 TABLET (5 MG TOTAL) BY MOUTH DAILY.   gabapentin 100 MG capsule Commonly known as: NEURONTIN Take 100 mg by mouth 2 (two) times daily.   Jardiance 25 MG Tabs tablet Generic drug: empagliflozin Take 25 mg by mouth daily.   losartan-hydrochlorothiazide 100-12.5 MG tablet Commonly known as: HYZAAR Take 1 tablet by mouth daily.        Allergies: No Known Allergies  Family History: Family History  Problem Relation Age of Onset   Hypertension Mother    Hypertension Father     Social History:  reports that he has never smoked. He has never used smokeless tobacco. He reports that he does not drink alcohol and does not use drugs.   Physical Exam: BP Marland Kitchen)  169/82   Pulse 74   Ht 5\' 10"  (1.778 m)   Wt 216 lb (98 kg)   BMI 30.99 kg/m   Constitutional:  Alert and oriented, No acute distress. HEENT: [place] AT Respiratory: Normal respiratory effort, no increased work of breathing. GU: Prostate 60+ cc, smooth without nodules Psychiatric: Normal mood and affect.   Assessment & Plan:    1.  Low risk prostate cancer Benign DRE PSA stable He elected to cancel his confirmatory biopsy, his PSA and DRE are stable.  Lab visit 6 months for PSA 1 year office visit PSA/DRE  2. BPH with LUTS Stable  PVR today 169 mL.  Continue finasteride.  1 year follow-up with  PVR.   I have reviewed the above documentation for accuracy and completeness, and I agree with the above.   Riki Altes, MD  [facility] [address], [address] [place], [place] [address] [phone]

## 2023-07-24 ENCOUNTER — Encounter: Payer: Self-pay | Admitting: Urology

## 2023-10-25 ENCOUNTER — Other Ambulatory Visit: Payer: Self-pay

## 2023-10-25 DIAGNOSIS — N401 Enlarged prostate with lower urinary tract symptoms: Secondary | ICD-10-CM

## 2023-10-26 ENCOUNTER — Ambulatory Visit: Payer: Self-pay | Admitting: Urology

## 2023-10-26 LAB — PSA: Prostate Specific Ag, Serum: 5 ng/mL — ABNORMAL HIGH (ref 0.0–4.0)

## 2023-12-27 ENCOUNTER — Encounter: Payer: Self-pay | Admitting: Urology

## 2024-01-25 ENCOUNTER — Other Ambulatory Visit: Payer: Self-pay | Admitting: Urology

## 2024-04-25 ENCOUNTER — Other Ambulatory Visit

## 2024-04-25 DIAGNOSIS — N401 Enlarged prostate with lower urinary tract symptoms: Secondary | ICD-10-CM

## 2024-04-26 LAB — PSA: Prostate Specific Ag, Serum: 4.4 ng/mL — ABNORMAL HIGH (ref 0.0–4.0)

## 2024-04-27 ENCOUNTER — Other Ambulatory Visit: Payer: Self-pay

## 2024-05-01 ENCOUNTER — Encounter: Payer: Self-pay | Admitting: Urology

## 2024-05-01 ENCOUNTER — Ambulatory Visit: Admitting: Urology

## 2024-05-01 VITALS — BP 138/75 | HR 78 | Ht 70.0 in | Wt 213.0 lb

## 2024-05-01 DIAGNOSIS — N401 Enlarged prostate with lower urinary tract symptoms: Secondary | ICD-10-CM

## 2024-05-01 DIAGNOSIS — R3914 Feeling of incomplete bladder emptying: Secondary | ICD-10-CM

## 2024-05-01 DIAGNOSIS — C61 Malignant neoplasm of prostate: Secondary | ICD-10-CM

## 2024-05-01 LAB — BLADDER SCAN AMB NON-IMAGING: Scan Result: 217

## 2024-05-01 NOTE — Progress Notes (Signed)
 05/01/2024 9:20 AM   [name] 1956/01/25 [id number]  Referring provider: Lenon Layman ORN, MD [address] [facility] [place],  [place] [address]  Chief Complaint  Patient presents with   Prostate Cancer   Urologic history: 1.  T1c low risk prostate cancer Biopsy 11/2018 PSA 18.17; prostate volume 130 g 4/12 cores positive Gleason 3+3 adenocarcinoma left mid (50%), left apex (3%), left base (40%) and left lateral mid (10%) After discussing management options elected active surveillance Started finasteride  August 2020 Prostate MRI 09/26/2019; 110 cc volume; no suspicious lesions for high-grade cancer; area left proximal femur with enhancement; MRI femur felt to represent an incidental chondroid lesion, no findings suspicious for metastatic disease Declined confirmatory biopsy  HPI: [name] is a 68 y.o. male presents for annual follow-up.  No complaints since last year's visit No bothersome LUTS Denies dysuria, gross hematuria Denies flank, abdominal or pelvic pain Remains on finasteride  PSA 04/25/2024 stable at 4.4   PSA trend    Prostate Specific Ag, Serum  Latest Ref Rng 0.0 - 4.0 ng/mL  08/11/2018 14.1 (H)   04/12/2019 10.1 (H)   04/14/2020 5.9 (H)   10/15/2020 7.1 (H)   04/16/2021 5.5 (H)   10/15/2021 6.3 (H)   04/19/2022 4.1 (H)   10/21/2022 4.5 (H)   04/22/2023 4.4 (H)   10/25/2023 5.0  04/25/2024 4.4    PMH: Past Medical History:  Diagnosis Date   Hypertension    Stroke [facility])     Surgical History: Past Surgical History:  Procedure Laterality Date   COLONOSCOPY N/A 06/19/2021   Procedure: COLONOSCOPY;  Surgeon: Onita Elspeth Sharper, DO;  [facility] ENDOSCOPY;  Service: Gastroenterology;  Laterality: N/A;  DM   none      Home Medications:  Allergies as of 05/01/2024   No Known Allergies      Medication List        Accurate as of May 01, 2024  9:20 AM. If you have any questions, ask your nurse or doctor.           amLODipine  10 MG tablet Commonly known as: NORVASC  Take 10 mg by mouth daily.   aspirin  EC 81 MG tablet   atorvastatin  40 MG tablet Commonly known as: LIPITOR Take 40 mg by mouth daily.   carvedilol  6.25 MG tablet Commonly known as: COREG  Take 3.125 mg by mouth 2 (two) times daily with a meal.   finasteride  5 MG tablet Commonly known as: PROSCAR  TAKE 1 TABLET (5 MG TOTAL) BY MOUTH DAILY.   gabapentin  100 MG capsule Commonly known as: NEURONTIN  Take 100 mg by mouth 2 (two) times daily.   Jardiance 25 MG Tabs tablet Generic drug: empagliflozin Take 25 mg by mouth daily.   losartan -hydrochlorothiazide  100-12.5 MG tablet Commonly known as: HYZAAR Take 1 tablet by mouth daily.        Allergies: No Known Allergies  Family History: Family History  Problem Relation Age of Onset   Hypertension Mother    Hypertension Father     Social History:  reports that he has never smoked. He has never used smokeless tobacco. He reports that he does not drink alcohol and does not use drugs.   Physical Exam: BP 138/75   Pulse 78   Ht 5' 10 (1.778 m)   Wt 213 lb (96.6 kg)   BMI 30.56 kg/m   Constitutional:  Alert, No acute distress. HEENT: [place] AT Respiratory: Normal respiratory effort, no increased work of breathing.  GU: Prostate 60+ cc, smooth without nodules Psychiatric: Normal mood and affect.   Assessment & Plan:    1.  T1c low risk prostate cancer Stable PSA/DRE Desires to continue active surveillance Lab visit 6 months PSA Office visit in 1 year PSA/DRE  2.  BPH with LUTS Stable PVR today 217 mL Continue finasteride  1 year follow-up with PVR   Glendia JAYSON Barba, MD  [facility] [address], [address] [place], [place] [address] [phone]

## 2024-05-03 ENCOUNTER — Ambulatory Visit: Payer: Self-pay | Admitting: Urology

## 2024-10-30 ENCOUNTER — Other Ambulatory Visit

## 2025-05-01 ENCOUNTER — Other Ambulatory Visit

## 2025-05-03 ENCOUNTER — Ambulatory Visit: Admitting: Urology
